# Patient Record
Sex: Female | Born: 1935 | Race: White | Hispanic: No
Health system: Southern US, Community
[De-identification: ages and names within clinical notes are randomized; demographics above are authoritative.]

## PROBLEM LIST (undated history)

## (undated) DIAGNOSIS — I10 Essential (primary) hypertension: Secondary | ICD-10-CM

## (undated) DIAGNOSIS — R0609 Other forms of dyspnea: Secondary | ICD-10-CM

## (undated) DIAGNOSIS — R0989 Other specified symptoms and signs involving the circulatory and respiratory systems: Secondary | ICD-10-CM

## (undated) DIAGNOSIS — R55 Syncope and collapse: Secondary | ICD-10-CM

## (undated) DIAGNOSIS — N179 Acute kidney failure, unspecified: Secondary | ICD-10-CM

## (undated) DIAGNOSIS — F0390 Unspecified dementia without behavioral disturbance: Secondary | ICD-10-CM

## (undated) DIAGNOSIS — R1110 Vomiting, unspecified: Secondary | ICD-10-CM

## (undated) DIAGNOSIS — R0600 Dyspnea, unspecified: Secondary | ICD-10-CM

## (undated) DIAGNOSIS — I447 Left bundle-branch block, unspecified: Secondary | ICD-10-CM

## (undated) DIAGNOSIS — J45909 Unspecified asthma, uncomplicated: Secondary | ICD-10-CM

## (undated) DIAGNOSIS — R011 Cardiac murmur, unspecified: Secondary | ICD-10-CM

## (undated) DIAGNOSIS — D751 Secondary polycythemia: Secondary | ICD-10-CM

## (undated) DIAGNOSIS — D45 Polycythemia vera: Secondary | ICD-10-CM

## (undated) HISTORY — DX: Other specified symptoms and signs involving the circulatory and respiratory systems: R09.89

## (undated) HISTORY — DX: Polycythemia vera: D45

## (undated) HISTORY — DX: Unspecified asthma, uncomplicated: J45.909

## (undated) HISTORY — DX: Other forms of dyspnea: R06.09

## (undated) HISTORY — DX: Unspecified dementia, unspecified severity, without behavioral disturbance, psychotic disturbance, mood disturbance, and anxiety: F03.90

## (undated) HISTORY — DX: Syncope and collapse: R55

## (undated) HISTORY — DX: Secondary polycythemia: D75.1

## (undated) HISTORY — PX: HEMORRHOID SURGERY: SHX153

## (undated) HISTORY — DX: Essential (primary) hypertension: I10

## (undated) HISTORY — DX: Dyspnea, unspecified: R06.00

## (undated) HISTORY — DX: Left bundle-branch block, unspecified: I44.7

## (undated) HISTORY — PX: CYST EXCISION: SHX5701

## (undated) HISTORY — DX: Cardiac murmur, unspecified: R01.1

---

## 2017-12-28 ENCOUNTER — Ambulatory Visit (INDEPENDENT_AMBULATORY_CARE_PROVIDER_SITE_OTHER): Payer: MEDICARE | Admitting: Allergy and Immunology

## 2017-12-28 ENCOUNTER — Encounter: Payer: Self-pay | Admitting: Allergy and Immunology

## 2017-12-28 VITALS — BP 110/70 | HR 80 | Temp 98.0°F | Resp 18 | Ht 60.0 in | Wt 134.2 lb

## 2017-12-28 DIAGNOSIS — I509 Heart failure, unspecified: Secondary | ICD-10-CM | POA: Diagnosis not present

## 2017-12-28 DIAGNOSIS — J3089 Other allergic rhinitis: Secondary | ICD-10-CM

## 2017-12-28 DIAGNOSIS — J4540 Moderate persistent asthma, uncomplicated: Secondary | ICD-10-CM | POA: Diagnosis not present

## 2017-12-28 DIAGNOSIS — E039 Hypothyroidism, unspecified: Secondary | ICD-10-CM

## 2017-12-28 DIAGNOSIS — R011 Cardiac murmur, unspecified: Secondary | ICD-10-CM

## 2017-12-28 DIAGNOSIS — D45 Polycythemia vera: Secondary | ICD-10-CM

## 2017-12-28 MED ORDER — MONTELUKAST SODIUM 10 MG PO TABS: 10.0000 mg | ORAL_TABLET | Freq: Every day | ORAL | 1 refills | Status: DC

## 2017-12-28 MED ORDER — ALBUTEROL SULFATE HFA 108 (90 BASE) MCG/ACT IN AERS: INHALATION_SPRAY | RESPIRATORY_TRACT | 1 refills | Status: DC

## 2017-12-28 NOTE — Progress Notes (Signed)
NEW PATIENT NOTE  Referring Provider: No ref. provider found Primary Provider: Patient, No Pcp Per Date of office visit: 12/28/2017    Subjective:   Chief Complaint:  Jennifer Melton (DOB: [birth year]) is a 84 y.o. female who presents to the clinic on 12/28/2017 with a chief complaint of Asthma .  HPI: Jennifer Melton presents to this clinic in evaluation of breathing issues.  She has recently moved in with her daughter located in [place] from [place].  She has a long history of "asthma".  But she cannot really remember the details of her asthma but she does know that she is using Dulera every day and using albuterol every day.  She does not really have any significant coughing or wheezing or shortness of breath although when she walks for a long period in time she may get a little bit out of breath.  She does not know if she has required any systemic steroids to treat this issue in a year.  Apparently she was skin tested many years ago and found to be allergic to various allergens and was started on immunotherapy which she has been using this therapy for years.  She also continues to use Flonase for her nose and continues to use montelukast but she is not really sure why she is using this medication.  Recently she had to go to the emergency room about a week or 2 ago for issues with shortness of breath and apparently she was told that she has congestive heart failure.  She is not really sure about what type of therapy she received in the emergency room but she does arrive today using Lasix which may have been a medication she has been using for years.  She has polycythemia vera and has been receiving monthly phlebotomy and has been placed on hydroxyurea for some period in time may be several years.  Past Medical History:  Diagnosis Date  . Asthma   . Dementia ([place])   . High blood pressure   . Polycythemia vera ([place])     Past Surgical History:  Procedure Laterality  Date  . CYST EXCISION     Cyst removal on left breast  . HEMORRHOID SURGERY      Allergies as of 12/28/2017   Not on File     Medication List      albuterol (2.5 MG/3ML) 0.083% nebulizer solution Commonly known as:  PROVENTIL Take 2.5 mg by nebulization every 4 (four) hours as needed for wheezing or shortness of breath.   cetirizine 10 MG tablet Commonly known as:  ZYRTEC Take 5 mg by mouth daily.   donepezil 10 MG tablet Commonly known as:  ARICEPT Take 10 mg by mouth daily.   DULERA 200-5 MCG/ACT Aero Generic drug:  mometasone-formoterol Inhale 2 puffs into the lungs 2 (two) times daily.   fluticasone 50 MCG/ACT nasal spray Commonly known as:  FLONASE Place 1 spray into both nostrils 2 (two) times daily.   furosemide 40 MG tablet Commonly known as:  LASIX Take 40 mg by mouth daily.   hydroxyurea 500 MG capsule Commonly known as:  HYDREA Take 500 mg by mouth daily. May take with food to minimize GI side effects.   ipratropium 0.06 % nasal spray Commonly known as:  ATROVENT Place 2 sprays into both nostrils 2 (two) times daily.   losartan 50 MG tablet Commonly known as:  COZAAR Take 50 mg by mouth daily.   montelukast 10 MG  tablet Commonly known as:  SINGULAIR Take 10 mg by mouth at bedtime.   Olopatadine HCl 0.6 % Soln Place 1 spray into both nostrils 2 (two) times daily.   PARoxetine 40 MG tablet Commonly known as:  PAXIL Take 40 mg by mouth daily.       Review of systems negative except as noted in HPI / PMHx or noted below:  Review of Systems  Constitutional: Negative.   HENT: Negative.   Eyes: Negative.   Respiratory: Negative.   Cardiovascular: Negative.   Gastrointestinal: Negative.   Genitourinary: Negative.   Musculoskeletal: Negative.   Skin: Negative.   Neurological: Negative.   Endo/Heme/Allergies: Negative.   Psychiatric/Behavioral: Negative.     Family History  Problem Relation Age of Onset  . Parkinson's disease Mother     . Heart attack Father   . Heart attack Brother   . Fibromyalgia Daughter     Social History   Socioeconomic History  . Marital status: Single    Spouse name: Not on file  . Number of children: Not on file  . Years of education: Not on file  . Highest education level: Not on file  Occupational History  . Not on file  Social Needs  . Financial resource strain: Not on file  . Food insecurity:    Worry: Not on file    Inability: Not on file  . Transportation needs:    Medical: Not on file    Non-medical: Not on file  Tobacco Use  . Smoking status: Passive Smoke Exposure - Never Smoker  . Smokeless tobacco: Never Used  Substance and Sexual Activity  . Alcohol use: Never    Frequency: Never  . Drug use: Never  . Sexual activity: Not on file  Lifestyle  . Physical activity:    Days per week: Not on file    Minutes per session: Not on file  . Stress: Not on file  Relationships  . Social connections:    Talks on phone: Not on file    Gets together: Not on file    Attends religious service: Not on file    Active member of club or organization: Not on file    Attends meetings of clubs or organizations: Not on file    Relationship status: Not on file  . Intimate partner violence:    Fear of current or ex partner: Not on file    Emotionally abused: Not on file    Physically abused: Not on file    Forced sexual activity: Not on file  Other Topics Concern  . Not on file  Social History Narrative  . Not on file    Environmental and Social history  Lives in a house with a dry environment, no animals located inside the household, no carpet in the bedroom, no plastic on the bed, no plastic on the pillow, no smoking ongoing with inside the household.  Objective:   Vitals:   12/28/17 1038  BP: 110/70  Pulse: 80  Resp: 18  Temp: 98 F (36.7 C)  SpO2: 94%   Height: 5' (152.4 cm) Weight: 134 lb 3.2 oz (60.9 kg)  Physical Exam  HENT:  Head: Normocephalic. Head is  without right periorbital erythema and without left periorbital erythema.  Right Ear: Tympanic membrane, external ear and ear canal normal.  Left Ear: Tympanic membrane, external ear and ear canal normal.  Nose: Nose normal. No mucosal edema or rhinorrhea.  Mouth/Throat: Oropharynx is clear and moist and mucous  membranes are normal. No oropharyngeal exudate.  Eyes: Pupils are equal, round, and reactive to light. Conjunctivae and lids are normal.  Neck: Trachea normal. No tracheal deviation present. No thyromegaly present.  Cardiovascular: Normal rate, regular rhythm, S1 normal and S2 normal.  Murmur (Systolic) heard. Pulmonary/Chest: Effort normal. No stridor. No respiratory distress. She has no wheezes. She has no rales. She exhibits no tenderness.  Abdominal: Soft. She exhibits no distension and no mass. There is no hepatosplenomegaly. There is no tenderness. There is no rebound and no guarding.  Musculoskeletal: She exhibits no edema or tenderness.  Lymphadenopathy:       Head (right side): No tonsillar adenopathy present.       Head (left side): No tonsillar adenopathy present.    She has no cervical adenopathy.    She has no axillary adenopathy.  Neurological: She is alert.  Skin: No rash noted. She is not diaphoretic. No erythema. No pallor. Nails show no clubbing.    Diagnostics: Allergy skin tests were not performed.   Spirometry was performed and demonstrated an FEV1 of 1.44 @ 110 % of predicted. FEV1/FVC = 0.84  Assessment and Plan:    1. Asthma, moderate persistent, well-controlled   2. Perennial allergic rhinitis   3. Chronic congestive heart failure, unspecified heart failure type ([place])   4. Systolic murmur   5. Polycythemia vera ([place])   6. Hypothyroidism, unspecified type     1. Continue the following medications:   A. Dulera 200 - 2 inhalations 2 times per day with spacer  B. Fluticasone - 1 spray each nostril 1 times per day  C. Montelukast 10mg  - 1 tablet one  time per day  2. If Needed:   A. Proventil HFA - 2 inhalations every 4-6 hours  B. Cetirizine 10mg  - 1 tablet 1 time per day  C. Nasal ipratropium 0.06% 2 sprays each nostril every 6 hours (drying)  3. Visit with Cardiologist for ECHO  4. Visit with Hematologist for polycythemia vera  5. Review recent chest x-ray, ekg, blood test results.  6. Blood - CBC w/diff, CMP, TSH, T4, BNP, area 2 allergen profile   7. Return in 4 weeks or earlier if problem. Taper medications?  8.  Annual flu vaccine very year  It is not entirely clear why Jennifer Melton has respiratory tract symptoms.  Certainly she may have a component of atopic respiratory disease but may also have a significant issue with stenotic valvular disease of her heart given her murmur and may also have an issue with polycythemia vera contributing to this issue.  I am going to obtain the blood tests noted above and investigation of these issues.  For now she will remain on anti-inflammatory agents for her airway as noted above.  I think in the long run we will be able to consolidate her treatment significantly as we hone down on the causes of her respiratory tract issues.  I will see her back in this clinic in 4 weeks or earlier if there is a problem.  Allena Katz, MD Allergy / Immunology [place]

## 2017-12-28 NOTE — Patient Instructions (Addendum)
  1. Continue the following medications:   A. Dulera 200 - 2 inhalations 2 times per day with spacer  B. Fluticasone - 1 spray each nostril 1 times per day  C. Montelukast 10mg  - 1 tablet one time per day  2. If Needed:   A. Proventil HFA - 2 inhalations every 4-6 hours  B. Cetirizine 10mg  - 1 tablet 1 time per day  C. Nasal ipratropium 0.06% 2 sprays each nostril every 6 hours (drying)  3. Visit with Cardiologist for ECHO  4. Visit with Hematologist for polycythemia vera  5. Review recent chest x-ray, ekg, blood test results.  6. Blood - CBC w/diff, CMP, TSH, T4, BNP, area 2 allergen profile   7. Return in 4 weeks or earlier if problem. Taper medications?  8.  Annual flu vaccine very year

## 2017-12-29 ENCOUNTER — Encounter: Payer: Self-pay | Admitting: Allergy and Immunology

## 2017-12-29 ENCOUNTER — Telehealth: Payer: Self-pay

## 2017-12-29 NOTE — Telephone Encounter (Signed)
Faxed office notes, demographics, insurance cards to Dr. Raquel Sarna office requesting referral for polycythemia vera.

## 2017-12-31 LAB — COMPREHENSIVE METABOLIC PANEL
A/G RATIO: 2 (ref 1.2–2.2)
ALT: 14 IU/L (ref 0–32)
AST: 17 IU/L (ref 0–40)
Albumin: 4.2 g/dL (ref 3.5–4.7)
Alkaline Phosphatase: 79 IU/L (ref 39–117)
BUN/Creatinine Ratio: 25 (ref 12–28)
BUN: 36 mg/dL — ABNORMAL HIGH (ref 8–27)
Bilirubin Total: 0.5 mg/dL (ref 0.0–1.2)
CO2: 30 mmol/L — ABNORMAL HIGH (ref 20–29)
Calcium: 9.4 mg/dL (ref 8.7–10.3)
Chloride: 93 mmol/L — ABNORMAL LOW (ref 96–106)
Creatinine, Ser: 1.46 mg/dL — ABNORMAL HIGH (ref 0.57–1.00)
GFR calc Af Amer: 37 mL/min/{1.73_m2} — ABNORMAL LOW (ref >59)
GFR calc non Af Amer: 32 mL/min/{1.73_m2} — ABNORMAL LOW (ref >59)
Globulin, Total: 2.1 g/dL (ref 1.5–4.5)
Glucose: 98 mg/dL (ref 65–99)
Potassium: 5.1 mmol/L (ref 3.5–5.2)
Sodium: 133 mmol/L — ABNORMAL LOW (ref 134–144)
Total Protein: 6.3 g/dL (ref 6.0–8.5)

## 2017-12-31 LAB — CBC WITH DIFFERENTIAL/PLATELET
Basophils Absolute: 0.1 10*3/uL (ref 0.0–0.2)
Basos: 1 %
EOS (ABSOLUTE): 0.2 10*3/uL (ref 0.0–0.4)
Eos: 4 %
HEMATOCRIT: 34.9 % (ref 34.0–46.6)
Hemoglobin: 12.1 g/dL (ref 11.1–15.9)
Lymphocytes Absolute: 1 10*3/uL (ref 0.7–3.1)
Lymphs: 20 %
MCH: 35 pg — ABNORMAL HIGH (ref 26.6–33.0)
MCHC: 34.7 g/dL (ref 31.5–35.7)
MCV: 101 fL — AB (ref 79–97)
Monocytes Absolute: 0.4 10*3/uL (ref 0.1–0.9)
Monocytes: 7 %
NEUTROS ABS: 3.4 10*3/uL (ref 1.4–7.0)
Neutrophils: 68 %
Platelets: 516 10*3/uL — ABNORMAL HIGH (ref 150–450)
RBC: 3.46 x10E6/uL — ABNORMAL LOW (ref 3.77–5.28)
RDW: 13.7 % (ref 12.3–15.4)
WBC: 5 10*3/uL (ref 3.4–10.8)

## 2017-12-31 LAB — ALLERGENS W/TOTAL IGE AREA 2
Alternaria Alternata IgE: <0.1 kU/L
Aspergillus Fumigatus IgE: <0.1 kU/L
Bermuda Grass IgE: <0.1 kU/L
Cat Dander IgE: <0.1 kU/L
Cedar, Mountain IgE: <0.1 kU/L
Cockroach, German IgE: <0.1 kU/L
Common Silver Birch IgE: <0.1 kU/L
Cottonwood IgE: <0.1 kU/L
D Farinae IgE: <0.1 kU/L
D Pteronyssinus IgE: <0.1 kU/L
Dog Dander IgE: <0.1 kU/L
IgE (Immunoglobulin E), Serum: 33 IU/mL (ref 6–495)
Maple/Box Elder IgE: <0.1 kU/L
Mouse Urine IgE: <0.1 kU/L
Pecan, Hickory IgE: <0.1 kU/L
Pigweed, Rough IgE: <0.1 kU/L
Ragweed, Short IgE: <0.1 kU/L

## 2017-12-31 LAB — TSH+FREE T4
FREE T4: 0.99 ng/dL (ref 0.82–1.77)
TSH: 4.28 u[IU]/mL (ref 0.450–4.500)

## 2017-12-31 LAB — BRAIN NATRIURETIC PEPTIDE: BNP: 43.1 pg/mL (ref 0.0–100.0)

## 2018-01-03 ENCOUNTER — Encounter (HOSPITAL_COMMUNITY): Payer: Self-pay | Admitting: Emergency Medicine

## 2018-01-03 ENCOUNTER — Observation Stay (HOSPITAL_COMMUNITY)
Admission: EM | Admit: 2018-01-03 | Discharge: 2018-01-05 | Disposition: A | Discharge status: Home / Self Care | Payer: MEDICARE | Attending: Internal Medicine | Admitting: Internal Medicine

## 2018-01-03 ENCOUNTER — Other Ambulatory Visit: Payer: Self-pay

## 2018-01-03 ENCOUNTER — Emergency Department (HOSPITAL_COMMUNITY): Payer: MEDICARE

## 2018-01-03 DIAGNOSIS — F419 Anxiety disorder, unspecified: Secondary | ICD-10-CM | POA: Insufficient documentation

## 2018-01-03 DIAGNOSIS — E039 Hypothyroidism, unspecified: Secondary | ICD-10-CM | POA: Diagnosis not present

## 2018-01-03 DIAGNOSIS — I130 Hypertensive heart and chronic kidney disease with heart failure and stage 1 through stage 4 chronic kidney disease, or unspecified chronic kidney disease: Secondary | ICD-10-CM | POA: Diagnosis not present

## 2018-01-03 DIAGNOSIS — Z7722 Contact with and (suspected) exposure to environmental tobacco smoke (acute) (chronic): Secondary | ICD-10-CM | POA: Diagnosis not present

## 2018-01-03 DIAGNOSIS — Z8249 Family history of ischemic heart disease and other diseases of the circulatory system: Secondary | ICD-10-CM | POA: Insufficient documentation

## 2018-01-03 DIAGNOSIS — I10 Essential (primary) hypertension: Secondary | ICD-10-CM | POA: Diagnosis present

## 2018-01-03 DIAGNOSIS — I447 Left bundle-branch block, unspecified: Secondary | ICD-10-CM | POA: Insufficient documentation

## 2018-01-03 DIAGNOSIS — I700 Atherosclerosis of aorta: Secondary | ICD-10-CM | POA: Diagnosis not present

## 2018-01-03 DIAGNOSIS — J45909 Unspecified asthma, uncomplicated: Secondary | ICD-10-CM | POA: Diagnosis present

## 2018-01-03 DIAGNOSIS — I080 Rheumatic disorders of both mitral and aortic valves: Secondary | ICD-10-CM | POA: Diagnosis not present

## 2018-01-03 DIAGNOSIS — Z23 Encounter for immunization: Secondary | ICD-10-CM | POA: Insufficient documentation

## 2018-01-03 DIAGNOSIS — J4531 Mild persistent asthma with (acute) exacerbation: Secondary | ICD-10-CM

## 2018-01-03 DIAGNOSIS — N179 Acute kidney failure, unspecified: Secondary | ICD-10-CM | POA: Diagnosis not present

## 2018-01-03 DIAGNOSIS — Z66 Do not resuscitate: Secondary | ICD-10-CM | POA: Diagnosis not present

## 2018-01-03 DIAGNOSIS — J4540 Moderate persistent asthma, uncomplicated: Secondary | ICD-10-CM | POA: Diagnosis not present

## 2018-01-03 DIAGNOSIS — F0390 Unspecified dementia without behavioral disturbance: Secondary | ICD-10-CM | POA: Insufficient documentation

## 2018-01-03 DIAGNOSIS — R55 Syncope and collapse: Secondary | ICD-10-CM | POA: Diagnosis not present

## 2018-01-03 DIAGNOSIS — I509 Heart failure, unspecified: Secondary | ICD-10-CM | POA: Insufficient documentation

## 2018-01-03 DIAGNOSIS — Z79899 Other long term (current) drug therapy: Secondary | ICD-10-CM | POA: Insufficient documentation

## 2018-01-03 DIAGNOSIS — N183 Chronic kidney disease, stage 3 (moderate): Secondary | ICD-10-CM | POA: Diagnosis not present

## 2018-01-03 DIAGNOSIS — I440 Atrioventricular block, first degree: Secondary | ICD-10-CM | POA: Insufficient documentation

## 2018-01-03 DIAGNOSIS — R1110 Vomiting, unspecified: Secondary | ICD-10-CM | POA: Insufficient documentation

## 2018-01-03 DIAGNOSIS — D45 Polycythemia vera: Secondary | ICD-10-CM | POA: Diagnosis present

## 2018-01-03 DIAGNOSIS — F329 Major depressive disorder, single episode, unspecified: Secondary | ICD-10-CM | POA: Diagnosis not present

## 2018-01-03 HISTORY — DX: Vomiting, unspecified: R11.10

## 2018-01-03 HISTORY — DX: Left bundle-branch block, unspecified: I44.7

## 2018-01-03 HISTORY — DX: Acute kidney failure, unspecified: N17.9

## 2018-01-03 LAB — COMPREHENSIVE METABOLIC PANEL
ALT: 14 U/L (ref 0–44)
AST: 21 U/L (ref 15–41)
Albumin: 3.7 g/dL (ref 3.5–5.0)
Alkaline Phosphatase: 61 U/L (ref 38–126)
Anion gap: 13 (ref 5–15)
BUN: 25 mg/dL — ABNORMAL HIGH (ref 8–23)
CO2: 29 mmol/L (ref 22–32)
Calcium: 9 mg/dL (ref 8.9–10.3)
Chloride: 93 mmol/L — ABNORMAL LOW (ref 98–111)
Creatinine, Ser: 1.51 mg/dL — ABNORMAL HIGH (ref 0.44–1.00)
GFR calc Af Amer: 36 mL/min — ABNORMAL LOW (ref >60)
GFR calc non Af Amer: 31 mL/min — ABNORMAL LOW (ref >60)
Glucose, Bld: 106 mg/dL — ABNORMAL HIGH (ref 70–99)
Potassium: 4.6 mmol/L (ref 3.5–5.1)
Sodium: 135 mmol/L (ref 135–145)
Total Bilirubin: 0.8 mg/dL (ref 0.3–1.2)
Total Protein: 6.2 g/dL — ABNORMAL LOW (ref 6.5–8.1)

## 2018-01-03 LAB — URINALYSIS, ROUTINE W REFLEX MICROSCOPIC
Bilirubin Urine: NEGATIVE
Glucose, UA: NEGATIVE mg/dL
Hgb urine dipstick: NEGATIVE
KETONES UR: NEGATIVE mg/dL
Leukocytes, UA: NEGATIVE
NITRITE: NEGATIVE
Protein, ur: NEGATIVE mg/dL
Specific Gravity, Urine: 1.011 (ref 1.005–1.030)
pH: 6 (ref 5.0–8.0)

## 2018-01-03 LAB — CBC
HCT: 36.1 % (ref 36.0–46.0)
Hemoglobin: 11.6 g/dL — ABNORMAL LOW (ref 12.0–15.0)
MCH: 35.5 pg — ABNORMAL HIGH (ref 26.0–34.0)
MCHC: 32.1 g/dL (ref 30.0–36.0)
MCV: 110.4 fL — ABNORMAL HIGH (ref 80.0–100.0)
Platelets: 474 10*3/uL — ABNORMAL HIGH (ref 150–400)
RBC: 3.27 MIL/uL — AB (ref 3.87–5.11)
RDW: 13.9 % (ref 11.5–15.5)
WBC: 6.3 10*3/uL (ref 4.0–10.5)
nRBC: 0 % (ref 0.0–0.2)

## 2018-01-03 LAB — I-STAT TROPONIN, ED: TROPONIN I, POC: 0 ng/mL (ref 0.00–0.08)

## 2018-01-03 LAB — BRAIN NATRIURETIC PEPTIDE: B Natriuretic Peptide: 92.5 pg/mL (ref 0.0–100.0)

## 2018-01-03 NOTE — ED Notes (Signed)
Patient transported to X-ray 

## 2018-01-03 NOTE — ED Provider Notes (Signed)
[facility] Emergency Department Provider Note MRN:  [id number]  Arrival date & time: 01/03/18     Chief Complaint   Near Syncope   History of Present Illness   Jennifer Melton is a 84 y.o. year-old female with a history of polycythemia vera, dementia presenting to the ED with chief complaint of near syncope.  Patient was at the hair salon at 2 PM when she experienced sudden onset nausea, single episode of nonbloody nonbilious emesis, significant lightheadedness.  Symptoms lasted for several minutes and then slowly resolved.  Brought to urgent care by her daughter.  Urgent care concerned about the symptoms in the setting of left bundle branch block, sent here for further evaluation.  Patient is currently asymptomatic.  No recent fever, no recent cough, no chest pain or shortness of breath during this episode today.  No abdominal pain, no back pain, did not lose consciousness, no recent trauma.  No numbness or weakness to the arms or legs.  Review of Systems  A complete 10 system review of systems was obtained and all systems are negative except as noted in the HPI and PMH.   Patient's Health History    Past Medical History:  Diagnosis Date  . Asthma   . Dementia ([place])   . High blood pressure   . Polycythemia vera ([place])     Past Surgical History:  Procedure Laterality Date  . CYST EXCISION     Cyst removal on left breast  . HEMORRHOID SURGERY      Family History  Problem Relation Age of Onset  . Parkinson's disease Mother   . Heart attack Father   . Heart attack Brother   . Fibromyalgia Daughter     Social History   Socioeconomic History  . Marital status: Single    Spouse name: Not on file  . Number of children: Not on file  . Years of education: Not on file  . Highest education level: Not on file  Occupational History  . Not on file  Social Needs  . Financial resource strain: Not on file  . Food insecurity:    Worry: Not on file    Inability:  Not on file  . Transportation needs:    Medical: Not on file    Non-medical: Not on file  Tobacco Use  . Smoking status: Passive Smoke Exposure - Never Smoker  . Smokeless tobacco: Never Used  Substance and Sexual Activity  . Alcohol use: Never    Frequency: Never  . Drug use: Never  . Sexual activity: Not on file  Lifestyle  . Physical activity:    Days per week: Not on file    Minutes per session: Not on file  . Stress: Not on file  Relationships  . Social connections:    Talks on phone: Not on file    Gets together: Not on file    Attends religious service: Not on file    Active member of club or organization: Not on file    Attends meetings of clubs or organizations: Not on file    Relationship status: Not on file  . Intimate partner violence:    Fear of current or ex partner: Not on file    Emotionally abused: Not on file    Physically abused: Not on file    Forced sexual activity: Not on file  Other Topics Concern  . Not on file  Social History Narrative  . Not on file  Physical Exam  Vital Signs and Nursing Notes reviewed Vitals:   01/03/18 2130 01/03/18 2145  BP: 140/66 (!) 145/59  Pulse: 66 64  Resp: 14 13  Temp:    SpO2: 98% 96%    CONSTITUTIONAL: Well-appearing, NAD NEURO:  Alert and oriented x 3, no focal deficits EYES:  eyes equal and reactive ENT/NECK:  no LAD, no JVD CARDIO: Regular rate, well-perfused, normal S1 and S2 PULM:  CTAB no wheezing or rhonchi GI/GU:  normal bowel sounds, non-distended, non-tender MSK/SPINE:  No gross deformities, no edema SKIN:  no rash, atraumatic PSYCH:  Appropriate speech and behavior  Diagnostic and Interventional Summary    EKG Interpretation  Date/Time:  Tuesday January 03 2018 19:42:37 EST Ventricular Rate:  63 PR Interval:  238 QRS Duration: 144 QT Interval:  464 QTC Calculation: 474 R Axis:   -68 Text Interpretation:  Sinus rhythm with 1st degree A-V block Left bundle branch block Abnormal ECG  Confirmed by Gerlene Fee [phone]) on 01/03/2018 11:04:39 PM      Labs Reviewed  CBC - Abnormal; Notable for the following components:      Result Value   RBC 3.27 (*)    Hemoglobin 11.6 (*)    MCV 110.4 (*)    MCH 35.5 (*)    Platelets 474 (*)    All other components within normal limits  COMPREHENSIVE METABOLIC PANEL - Abnormal; Notable for the following components:   Chloride 93 (*)    Glucose, Bld 106 (*)    BUN 25 (*)    Creatinine, Ser 1.51 (*)    Total Protein 6.2 (*)    GFR calc non Af Amer 31 (*)    GFR calc Af Amer 36 (*)    All other components within normal limits  URINALYSIS, ROUTINE W REFLEX MICROSCOPIC  BRAIN NATRIURETIC PEPTIDE  I-STAT TROPONIN, ED    DG Chest 2 View  Final Result      Medications - No data to display   Procedures Critical Care  ED Course and Medical Decision Making  I have reviewed the triage vital signs and the nursing notes.  Pertinent labs & imaging results that were available during my care of the patient were reviewed by me and considered in my medical decision making (see below for details).  Near syncopal episode in this 84 year old female, no chest pain, single episode emesis.  Unclear etiology, would be atypical for ACS, considering arrhythmia, will place on cardiac monitoring here, work-up pending.  Normal neurological exam, nothing to suggest indication for CNS imaging.  Work-up unremarkable, troponin negative.  Some records of the patient were found, patient does have a history of CHF, polycythemia vera.  Admitted to hospitalist service for further care and evaluation of high risk syncope.  Barth Kirks. Sedonia Small, [place] [email or web address]  Final Clinical Impressions(s) / ED Diagnoses     ICD-10-CM   1. Near syncope R55   2. Emesis R11.10 DG Chest 2 View    DG Chest 2 View    ED Discharge Orders    None         Maudie Flakes, MD 01/03/18 2330

## 2018-01-03 NOTE — ED Triage Notes (Signed)
Pt arrives to ED from [facility] with complaints of a new left BBB. EMS reports pt had a near syncopal episode around 2pm today where she projectile vomited. Pt was seen at the urgent care where they found the LBBB. Pt placed in position of comfort with bed locked and lowered, call bell in reach.

## 2018-01-04 ENCOUNTER — Other Ambulatory Visit (HOSPITAL_COMMUNITY): Payer: PRIVATE HEALTH INSURANCE

## 2018-01-04 ENCOUNTER — Observation Stay (HOSPITAL_BASED_OUTPATIENT_CLINIC_OR_DEPARTMENT_OTHER): Payer: MEDICARE

## 2018-01-04 ENCOUNTER — Encounter (HOSPITAL_COMMUNITY): Payer: Self-pay | Admitting: Internal Medicine

## 2018-01-04 DIAGNOSIS — R55 Syncope and collapse: Secondary | ICD-10-CM | POA: Diagnosis not present

## 2018-01-04 DIAGNOSIS — I340 Nonrheumatic mitral (valve) insufficiency: Secondary | ICD-10-CM | POA: Diagnosis not present

## 2018-01-04 DIAGNOSIS — I351 Nonrheumatic aortic (valve) insufficiency: Secondary | ICD-10-CM | POA: Diagnosis not present

## 2018-01-04 DIAGNOSIS — J45909 Unspecified asthma, uncomplicated: Secondary | ICD-10-CM | POA: Diagnosis present

## 2018-01-04 DIAGNOSIS — I447 Left bundle-branch block, unspecified: Secondary | ICD-10-CM | POA: Insufficient documentation

## 2018-01-04 DIAGNOSIS — D45 Polycythemia vera: Secondary | ICD-10-CM | POA: Diagnosis present

## 2018-01-04 DIAGNOSIS — I10 Essential (primary) hypertension: Secondary | ICD-10-CM | POA: Diagnosis present

## 2018-01-04 DIAGNOSIS — N179 Acute kidney failure, unspecified: Secondary | ICD-10-CM | POA: Diagnosis present

## 2018-01-04 HISTORY — DX: Syncope and collapse: R55

## 2018-01-04 LAB — CBC
HCT: 34.4 % — ABNORMAL LOW (ref 36.0–46.0)
HEMOGLOBIN: 10.7 g/dL — AB (ref 12.0–15.0)
MCH: 34.2 pg — ABNORMAL HIGH (ref 26.0–34.0)
MCHC: 31.1 g/dL (ref 30.0–36.0)
MCV: 109.9 fL — AB (ref 80.0–100.0)
Platelets: 497 10*3/uL — ABNORMAL HIGH (ref 150–400)
RBC: 3.13 MIL/uL — ABNORMAL LOW (ref 3.87–5.11)
RDW: 13.8 % (ref 11.5–15.5)
WBC: 6.1 10*3/uL (ref 4.0–10.5)
nRBC: 0 % (ref 0.0–0.2)

## 2018-01-04 LAB — BASIC METABOLIC PANEL
Anion gap: 8 (ref 5–15)
BUN: 23 mg/dL (ref 8–23)
CO2: 32 mmol/L (ref 22–32)
Calcium: 8.7 mg/dL — ABNORMAL LOW (ref 8.9–10.3)
Chloride: 95 mmol/L — ABNORMAL LOW (ref 98–111)
Creatinine, Ser: 1.47 mg/dL — ABNORMAL HIGH (ref 0.44–1.00)
GFR calc Af Amer: 37 mL/min — ABNORMAL LOW (ref >60)
GFR calc non Af Amer: 32 mL/min — ABNORMAL LOW (ref >60)
Glucose, Bld: 85 mg/dL (ref 70–99)
Potassium: 4.3 mmol/L (ref 3.5–5.1)
Sodium: 135 mmol/L (ref 135–145)

## 2018-01-04 LAB — ECHOCARDIOGRAM COMPLETE
HEIGHTINCHES: 60 in
Weight: 2147.21 oz

## 2018-01-04 LAB — VITAMIN B12: Vitamin B-12: 225 pg/mL (ref 180–914)

## 2018-01-04 MED ORDER — LOSARTAN POTASSIUM 50 MG PO TABS: 50.0000 mg | ORAL_TABLET | Freq: Every day | ORAL | Status: DC

## 2018-01-04 MED ORDER — DONEPEZIL HCL 10 MG PO TABS
10.0000 mg | ORAL_TABLET | Freq: Every day | ORAL | Status: DC
  Administered 2018-01-04 – 2018-01-05 (×2): 10 mg via ORAL
  Filled 2018-01-04: qty 1
  Filled 2018-01-04: qty 2

## 2018-01-04 MED ORDER — HEPARIN SODIUM (PORCINE) 5000 UNIT/ML IJ SOLN
5000.0000 [IU] | Freq: Three times a day (TID) | INTRAMUSCULAR | Status: DC
  Administered 2018-01-04 – 2018-01-05 (×4): 5000 [IU] via SUBCUTANEOUS
  Filled 2018-01-04 (×4): qty 1

## 2018-01-04 MED ORDER — SODIUM CHLORIDE 0.9 % IV SOLN
INTRAVENOUS | Status: AC
  Administered 2018-01-04: 12:00:00 via INTRAVENOUS

## 2018-01-04 MED ORDER — IPRATROPIUM BROMIDE 0.06 % NA SOLN
2.0000 | Freq: Two times a day (BID) | NASAL | Status: DC
  Administered 2018-01-04 – 2018-01-05 (×3): 2 via NASAL
  Filled 2018-01-04: qty 15

## 2018-01-04 MED ORDER — METOPROLOL SUCCINATE ER 25 MG PO TB24: 25.0000 mg | ORAL_TABLET | Freq: Every day | ORAL | Status: DC

## 2018-01-04 MED ORDER — INFLUENZA VAC SPLIT HIGH-DOSE 0.5 ML IM SUSY
0.5000 mL | PREFILLED_SYRINGE | INTRAMUSCULAR | Status: AC
  Administered 2018-01-05: 0.5 mL via INTRAMUSCULAR
  Filled 2018-01-04: qty 0.5

## 2018-01-04 MED ORDER — SODIUM CHLORIDE 0.9% FLUSH
3.0000 mL | Freq: Two times a day (BID) | INTRAVENOUS | Status: DC
  Administered 2018-01-05: 3 mL via INTRAVENOUS

## 2018-01-04 MED ORDER — MOMETASONE FURO-FORMOTEROL FUM 200-5 MCG/ACT IN AERO
2.0000 | INHALATION_SPRAY | Freq: Two times a day (BID) | RESPIRATORY_TRACT | Status: DC
  Administered 2018-01-04 – 2018-01-05 (×2): 2 via RESPIRATORY_TRACT
  Filled 2018-01-04 (×3): qty 8.8

## 2018-01-04 MED ORDER — DIPHENHYDRAMINE HCL 25 MG PO CAPS
25.0000 mg | ORAL_CAPSULE | Freq: Every evening | ORAL | Status: DC | PRN
  Administered 2018-01-05: 25 mg via ORAL
  Filled 2018-01-04: qty 1

## 2018-01-04 MED ORDER — LORATADINE 10 MG PO TABS
10.0000 mg | ORAL_TABLET | Freq: Every day | ORAL | Status: DC
  Administered 2018-01-04 – 2018-01-05 (×2): 10 mg via ORAL
  Filled 2018-01-04 (×2): qty 1

## 2018-01-04 MED ORDER — HYDROXYUREA 500 MG PO CAPS
500.0000 mg | ORAL_CAPSULE | Freq: Every day | ORAL | Status: DC
  Administered 2018-01-04 – 2018-01-05 (×2): 500 mg via ORAL
  Filled 2018-01-04 (×2): qty 1

## 2018-01-04 MED ORDER — ALBUTEROL SULFATE (2.5 MG/3ML) 0.083% IN NEBU: 2.5000 mg | INHALATION_SOLUTION | RESPIRATORY_TRACT | Status: DC | PRN

## 2018-01-04 MED ORDER — FLUTICASONE PROPIONATE 50 MCG/ACT NA SUSP
1.0000 | Freq: Two times a day (BID) | NASAL | Status: DC
  Administered 2018-01-04 – 2018-01-05 (×3): 1 via NASAL
  Filled 2018-01-04: qty 16

## 2018-01-04 MED ORDER — PAROXETINE HCL 20 MG PO TABS
40.0000 mg | ORAL_TABLET | Freq: Every day | ORAL | Status: DC
  Administered 2018-01-04 – 2018-01-05 (×2): 40 mg via ORAL
  Filled 2018-01-04 (×2): qty 2

## 2018-01-04 MED ORDER — MONTELUKAST SODIUM 10 MG PO TABS
10.0000 mg | ORAL_TABLET | Freq: Every day | ORAL | Status: DC
  Administered 2018-01-04: 10 mg via ORAL
  Filled 2018-01-04: qty 1

## 2018-01-04 NOTE — Progress Notes (Signed)
Amber Cox granddaughter [phone].  Wants more information.

## 2018-01-04 NOTE — Progress Notes (Signed)
Pt feeling intermittently flushed.  Pulse 66.  Resp rate normal.  Not diaphoretic.  Not endorsing chest pain.

## 2018-01-04 NOTE — Progress Notes (Signed)
DNR band placed on patient's left wrist.

## 2018-01-04 NOTE — ED Notes (Signed)
Patient transported to echo ?

## 2018-01-04 NOTE — Progress Notes (Addendum)
H & P after midnight. Briefly 84 yo hx moderate persistent asthma, polycythemia vera, anxiety and ? HF admitted with near syncope. Became lightheaded, nausea with vomiting, cool/clammy. No cp, palpitations, fever, chills. Reports being hospitalized 3 weeks ago for chf and bronchitis. In ED EKG revealed LBBB.  Cardiology asked to consult  PE Gen. Awake alert oriented no acute distress CV: rrr +murmur no LE edema Resp: no increased work of breathing no wheeze Abdomen soft+BS no guarding Neuro: speech clear facial symmetry     Plan:  Follow echo Gentle IV fluids Holding home lasix and losartan for now Monitor BP Continue home meds Recheck bmet in am   Jennifer Glad m. Anelly Samarin, NP

## 2018-01-04 NOTE — Progress Notes (Signed)
  Echocardiogram 2D Echocardiogram has been performed.  Jennette Dubin 01/04/2018, 10:48 AM

## 2018-01-04 NOTE — Consult Note (Signed)
CARDIOLOGY CONSULT NOTE  Patient ID: Jennifer Melton MRN: [id number] DOB/AGE: 1927-06-24 84 y.o.  Admit date: 01/03/2018 Referring Physician  Cheri Rous, MD Primary Physician:  Algis Greenhouse, MD Reason for Consultation  Dizziness  HPI: Jennifer Melton  is a 84 y.o. female  With longstanding history of bronchial asthma, hypothyroidism, polycythemia vera, left bundle branch block, chronic dyspnea and chronic stage III kidney disease admitted to the hospital with nausea and vomiting is the main complaint and not syncope.  Patient was at the hairdresser when she suddenly started feeling hot, clammy and had severe nausea.  This was followed by profuse vomiting.  She also felt dizzy after this.  There was no syncope reported.  No chest pain, no shortness of breath.  She was taken to the urgent care and due to abnormal EKG was referred to go to the emergency room.  She was admitted for further evaluation and management with the working diagnosis of near syncope, nausea vomiting and abnormal EKG.  Patient's main complaint is suddenly feeling hot and that has been ongoing for several years.  She feels like it is like "hot flashes" and feels slightly clammy.  She has never had syncope, never had dizziness except one episode sometime this afternoon.  She returned from the bathroom and felt dizzy.  Her daughter is present at the bedside.  Past Medical History:  Diagnosis Date  . Acute kidney injury ([place])   . Asthma   . Dementia ([place])   . High blood pressure   . LBBB (left bundle branch block)   . Polycythemia vera ([place])   . Vomiting 01/03/2018     Past Surgical History:  Procedure Laterality Date  . CYST EXCISION     Cyst removal on left breast  . HEMORRHOID SURGERY       Family History  Problem Relation Age of Onset  . Parkinson's disease Mother   . Heart attack Father   . Heart attack Brother   . Fibromyalgia Daughter      Social History: Social History   Socioeconomic  History  . Marital status: Single    Spouse name: Not on file  . Number of children: Not on file  . Years of education: Not on file  . Highest education level: Not on file  Occupational History  . Not on file  Social Needs  . Financial resource strain: Not on file  . Food insecurity:    Worry: Not on file    Inability: Not on file  . Transportation needs:    Medical: Not on file    Non-medical: Not on file  Tobacco Use  . Smoking status: Passive Smoke Exposure - Never Smoker  . Smokeless tobacco: Never Used  Substance and Sexual Activity  . Alcohol use: Never    Frequency: Never  . Drug use: Never  . Sexual activity: Not on file  Lifestyle  . Physical activity:    Days per week: Not on file    Minutes per session: Not on file  . Stress: Not on file  Relationships  . Social connections:    Talks on phone: Not on file    Gets together: Not on file    Attends religious service: Not on file    Active member of club or organization: Not on file    Attends meetings of clubs or organizations: Not on file    Relationship status: Not on file  . Intimate partner violence:    Fear of  current or ex partner: Not on file    Emotionally abused: Not on file    Physically abused: Not on file    Forced sexual activity: Not on file  Other Topics Concern  . Not on file  Social History Narrative  . Not on file     Medications Prior to Admission  Medication Sig Dispense Refill Last Dose  . albuterol (PROVENTIL HFA) 108 (90 Base) MCG/ACT inhaler Inhale two puffs every four to six hours as needed for cough or wheeze. 1 Inhaler 1 Past Month at Unknown time  . albuterol (PROVENTIL) (2.5 MG/3ML) 0.083% nebulizer solution Take 2.5 mg by nebulization every 4 (four) hours as needed for wheezing or shortness of breath.   Past Month at Unknown time  . cetirizine (ZYRTEC) 10 MG tablet Take 10 mg by mouth daily as needed for allergies.    Past Month at Unknown time  . donepezil (ARICEPT) 10 MG  tablet Take 10 mg by mouth daily.   01/02/2018 at Unknown time  . fluticasone (FLONASE) 50 MCG/ACT nasal spray Place 1 spray into both nostrils 2 (two) times daily.   01/02/2018 at Unknown time  . furosemide (LASIX) 40 MG tablet Take 40 mg by mouth daily.   01/02/2018 at Unknown time  . hydroxyurea (HYDREA) 500 MG capsule Take 500 mg by mouth daily. May take with food to minimize GI side effects.   01/02/2018 at Unknown time  . ipratropium (ATROVENT) 0.06 % nasal spray Place 2 sprays into both nostrils 2 (two) times daily.   01/02/2018 at Unknown time  . losartan (COZAAR) 50 MG tablet Take 50 mg by mouth daily.   01/02/2018 at Unknown time  . mometasone-formoterol (DULERA) 200-5 MCG/ACT AERO Inhale 2 puffs into the lungs 2 (two) times daily.   01/02/2018 at Unknown time  . montelukast (SINGULAIR) 10 MG tablet Take 1 tablet (10 mg total) by mouth at bedtime. 90 tablet 1 Past Week at Unknown time  . Olopatadine HCl 0.6 % SOLN Place 1 spray into both nostrils 2 (two) times daily as needed (allergies).    Past Month at Unknown time  . PARoxetine (PAXIL) 40 MG tablet Take 40 mg by mouth daily.   01/02/2018 at Unknown time   Review of Systems  Constitutional: Negative.   HENT: Negative.   Eyes: Negative.   Respiratory: Positive for shortness of breath (slight worsening dyspnea over few months. ). Negative for hemoptysis and wheezing.   Cardiovascular: Negative.   Gastrointestinal: Positive for nausea and vomiting.  Genitourinary: Negative.   Skin: Negative.   Endo/Heme/Allergies: Negative.   Psychiatric/Behavioral: Negative.   All other systems reviewed and are negative.   Physical Exam: Blood pressure (!) 141/80, pulse 64, temperature 98.1 F (36.7 C), temperature source Oral, resp. rate 16, height 5' (1.524 m), weight 60.9 kg, SpO2 96 %.  Physical Exam  Constitutional: She is oriented to person, place, and time. She appears well-developed and well-nourished. No distress.  HENT:  Head: Atraumatic.   Eyes: Conjunctivae are normal.  Neck: Neck supple. No JVD present.  Cardiovascular: Normal rate, regular rhythm and intact distal pulses. Exam reveals no gallop.  Murmur (1/6 to 2/6 systolic ejection murmur in the right sternal border) heard. Pulses:      Carotid pulses are on the right side with bruit, and on the left side with bruit. Varisose veings  Pulmonary/Chest: Breath sounds normal.  Abdominal: Soft. Bowel sounds are normal.  Neurological: She is alert and oriented to person, place, and  time.  Skin: Skin is warm and dry.  Psychiatric: She has a normal mood and affect.  Labs:   Lab Results  Component Value Date   WBC 6.1 01/04/2018   HGB 10.7 (L) 01/04/2018   HCT 34.4 (L) 01/04/2018   MCV 109.9 (H) 01/04/2018   PLT 497 (H) 01/04/2018    Recent Labs  Lab 01/03/18 1910 01/04/18 0242  NA 135 135  K 4.6 4.3  CL 93* 95*  CO2 29 32  BUN 25* 23  CREATININE 1.51* 1.47*  CALCIUM 9.0 8.7*  PROT 6.2*  --   BILITOT 0.8  --   ALKPHOS 61  --   ALT 14  --   AST 21  --   GLUCOSE 106* 85    Lipid Panel  No results found for: CHOL, TRIG, HDL, CHOLHDL, VLDL, LDLCALC  BNP (last 3 results) Recent Labs    12/28/17 1154 01/03/18 1910  BNP 43.1 92.5  TSH Recent Labs    12/28/17 1154  TSH 4.280     Radiology: Dg Chest 2 View  Result Date: 01/03/2018 CLINICAL DATA:  Emesis EXAM: CHEST - 2 VIEW COMPARISON:  None. FINDINGS: Cardiac enlargement without heart failure. Atherosclerotic aortic arch. Lungs are clear without infiltrate or effusion. IMPRESSION: No active cardiopulmonary disease. Electronically Signed   By: Franchot Gallo M.D.   On: 01/03/2018 19:46    Scheduled Meds: . donepezil  10 mg Oral Daily  . fluticasone  1 spray Each Nare BID  . heparin  5,000 Units Subcutaneous Q8H  . hydroxyurea  500 mg Oral Daily  . [START ON 01/05/2018] Influenza vac split quadrivalent PF  0.5 mL Intramuscular Tomorrow-1000  . ipratropium  2 spray Each Nare BID  . loratadine   10 mg Oral Daily  . mometasone-formoterol  2 puff Inhalation BID  . montelukast  10 mg Oral QHS  . PARoxetine  40 mg Oral Daily  . sodium chloride flush  3 mL Intravenous Q12H   Continuous Infusions: . sodium chloride 50 mL/hr at 01/04/18 1155   PRN Meds:.albuterol  CARDIAC STUDIES:  EKG: 01/04/2018: Sinus rhythm with first-degree AV block, underlying left bundle branch block.  No further analysis.  ECHO: 01/04/2018: Normal LV systolic function, EF 55 to 60%.  Grade 1 diastolic dysfunction.  Mild aortic regurgitation and mitral regurgitation.  Mild biatrial enlargement.  ASSESSMENT AND PLAN:  1.  Vasovagal symptoms of nausea and dizziness, preceding feeling heart.  No frank syncope. 2.  Abnormal EKG with first-degree AV block and left bundle branch block, clinically asymptomatic without syncope, bradycardia or decreased exercise tolerance although she does have slight worsening dyspnea on exertion recently. 3.  3 beat run of NSVT this morning, asymptomatic. 4.  Polycythemia vera, presently on hydroxyurea.  Also has anemia and thrombocytopenia and this is also contributed by hydroxyurea. 5.  Hot flashes, etiology unknown. 6.  Chronic stage III kidney disease, stable from the labs that I have in the office as well.  Do not think dehydration is an etiology.  Recommendation: Although patient's symptoms are vasovagal, there is no cardiac work-up necessary for now, normal LVEF, underlying LBBB, does need ischemic work-up but can be performed in the outpatient basis.  Symptoms of "hot flashes" may be related to hydroxyurea therapy which can cause nausea, vomiting, GI issues and can also cause facial erythema.  Patient is also on paroxetine which can cause serotonin-like symptoms including dizziness, diaphoresis and GI issues.  Appears that hydroxyurea may be contributing to some of  her symptoms including anemia and thrombocytopenia.  With regard to NSVT, with underlying first-degree AV block,  LBBB, advanced age, she may have conduction system disease, hence would be careful with addition of medications but will try metoprolol succinate 25 mg daily.  Will administer 1 dose tonight so while she is on telemetry we can observe any significant arrhythmias and heart block.  If she remains stable by tomorrow morning, she can be discharged home and I will continue to follow her in the outpatient basis.  Adrian Prows, MD 01/04/2018, 7:27 PM [facility] [place] Pager: [phone] Office: [phone] If no answer Cell [phone]

## 2018-01-04 NOTE — H&P (Signed)
History and Physical   Jennifer Melton BJS:[id number] DOB: [birth year] DOA: 01/03/2018  PCP: Patient, No Pcp Per  Chief Complaint: near syncope  HPI: This is an 84 year old woman with medical problems including moderate persistent asthma, polycythemia vera, anxiety/depression, and reported history of heart failure presenting with near syncope.  History is obtained via patient and daughter report.  They report the patient moved in with her daughter within the past month.  At baseline, the patient ambulates without assist devices, is independent in her ADLs, but does need IADL support.  She formally worked in [employer], does not smoke.  Formally lived in [place].  Events leading to this admission include, she was at a beauty shop where she was sitting down and became lightheaded and had multiple episodes of emesis.  She had feelings of hot and cold, but did not pass out.  Denies palpitations, chest pain, fevers, chills.  Denies dehydration, or inability to keep down p.o. prior to this episode.  Has never had these symptoms before, but was hospitalized 2 to 3 weeks ago for CHF and bronchitis.  Due to the symptoms she was brought to an urgent care where an EKG was performed which revealed left bundle branch block.  This resulted in her coming to the emergency department be further evaluated.    She does use a nebulizer treatment of bronchodilators about once daily.  She has established allergy immunology care for her asthma locally.  Has yet to establish a hematologist oncologist for management of her polycythemia vera.  Denies ever having heart attack or stroke.  She does have dyspnea on exertion, but no chest pain.  ED Course: In the emergency department vital signs are normal for systolic blood pressure 607, heart rate 64.  CBC remarkable for hemoglobin 11.6 MCV 110, BMP remarkable for creatinine 1.5, BUN 25.  Troponin within normal limits.  BNP within normal limits.  EKG revealed  sinus rhythm with first-degree AV nodal block, left bundle branch block present.  Hospital medicine was consulted for further management.  Review of Systems: A complete ROS was obtained; pertinent positives negatives are denoted in the HPI. Otherwise, all systems are negative.   Past Medical History:  Diagnosis Date  . Asthma   . Dementia ([place])   . High blood pressure   . Polycythemia vera ([place])    Social History   Socioeconomic History  . Marital status: Single    Spouse name: Not on file  . Number of children: Not on file  . Years of education: Not on file  . Highest education level: Not on file  Occupational History  . Not on file  Social Needs  . Financial resource strain: Not on file  . Food insecurity:    Worry: Not on file    Inability: Not on file  . Transportation needs:    Medical: Not on file    Non-medical: Not on file  Tobacco Use  . Smoking status: Passive Smoke Exposure - Never Smoker  . Smokeless tobacco: Never Used  Substance and Sexual Activity  . Alcohol use: Never    Frequency: Never  . Drug use: Never  . Sexual activity: Not on file  Lifestyle  . Physical activity:    Days per week: Not on file    Minutes per session: Not on file  . Stress: Not on file  Relationships  . Social connections:    Talks on phone: Not on file    Gets together: Not on  file    Attends religious service: Not on file    Active member of club or organization: Not on file    Attends meetings of clubs or organizations: Not on file    Relationship status: Not on file  . Intimate partner violence:    Fear of current or ex partner: Not on file    Emotionally abused: Not on file    Physically abused: Not on file    Forced sexual activity: Not on file  Other Topics Concern  . Not on file  Social History Narrative  . Not on file   Family History  Problem Relation Age of Onset  . Parkinson's disease Mother   . Heart attack Father   . Heart attack Brother   .  Fibromyalgia Daughter     Physical Exam: Vitals:   01/03/18 2145 01/04/18 0004 01/04/18 0030 01/04/18 0104  BP: (!) 145/59 (!) 142/74 (!) 137/54 (!) 130/96  Pulse: 64 82 65 64  Resp: 13 15 18    Temp:      TempSrc:      SpO2: 96% 99% 94% 95%  Weight:      Height:       General: Appears calm and comfortable.  Pleasant white elderly woman. ENT: Hard of hearing, moist mucous membranes. Cardiovascular: RRR. No M/R/G. No LE edema.  Respiratory: CTA bilaterally. No wheezes or crackles. Normal respiratory effort.  Breathing room air Abdomen: Soft, non-tender.  Skin: No rash or induration seen on limited exam. Musculoskeletal: Grossly normal tone BUE/BLE. Appropriate ROM.  Psychiatric: Grossly normal mood and affect. Neurologic: Moves all extremities in coordinated fashion.  Oriented to year.  I have personally reviewed the following labs, culture data, and imaging studies.  Assessment/Plan:  #Near syncope Course: symptoms of light headedness, nausea, vomiting, and near syncope; no overt preceding symptoms, occurred when sitting at beauty shop.  EKG with LBBB and 1st degree AV nodal block.  A/P: will monitor with telemetry to evaluate for arrhythmia etiology to near syncope; TTE to evaluate for structural causes.  Orthostatic vital signs ordered.  Unknown BUN/CR baseline, it is possible that she has been over-diuresed; will hold home diuretic therapy.  #Other problems: -Asthma: does not appear to be in exacerbation, continue home medications -Polycythemia vera: continue home hydroxyurea, needs to establish hem/onc locally; hydrea is cause of macrocytosis likely, but will obtain b12 for completeness -HF, hx of: unknown type; holding diuretic; appears to not be in HF -Anxiety / depression: continue home SSRI -CKD vs AKI: uncertain bl BUN/CR however, BUN/CR on admission of 25 / 1.51; trend with diuresis holding, attempt to obtain outside records for comparison -HTN: continue ARB  DVT  prophylaxis: Subq Lovenox Code Status: DNR / DNI  Disposition Plan: Anticipate D/C home within 24-48 hours likely Consults called:  none Admission status: admit to hospital medicine, telemetry    Cheri Rous, MD [facility] [phone]  If 7PM-7AM, please contact night-coverage [email or web address] Password [email or web address]  This document was created using the aid of voice recognition / dication software.

## 2018-01-04 NOTE — ED Notes (Signed)
Patient's daughter at bedside.

## 2018-01-05 DIAGNOSIS — N179 Acute kidney failure, unspecified: Secondary | ICD-10-CM | POA: Diagnosis not present

## 2018-01-05 DIAGNOSIS — R55 Syncope and collapse: Secondary | ICD-10-CM | POA: Diagnosis not present

## 2018-01-05 DIAGNOSIS — I10 Essential (primary) hypertension: Secondary | ICD-10-CM | POA: Diagnosis not present

## 2018-01-05 DIAGNOSIS — J4531 Mild persistent asthma with (acute) exacerbation: Secondary | ICD-10-CM | POA: Diagnosis not present

## 2018-01-05 DIAGNOSIS — D45 Polycythemia vera: Secondary | ICD-10-CM

## 2018-01-05 LAB — BASIC METABOLIC PANEL
ANION GAP: 9 (ref 5–15)
BUN: 22 mg/dL (ref 8–23)
CO2: 27 mmol/L (ref 22–32)
Calcium: 8.2 mg/dL — ABNORMAL LOW (ref 8.9–10.3)
Chloride: 100 mmol/L (ref 98–111)
Creatinine, Ser: 1.53 mg/dL — ABNORMAL HIGH (ref 0.44–1.00)
GFR calc Af Amer: 35 mL/min — ABNORMAL LOW (ref >60)
GFR calc non Af Amer: 30 mL/min — ABNORMAL LOW (ref >60)
Glucose, Bld: 86 mg/dL (ref 70–99)
Potassium: 3.9 mmol/L (ref 3.5–5.1)
Sodium: 136 mmol/L (ref 135–145)

## 2018-01-05 MED ORDER — METOPROLOL SUCCINATE ER 25 MG PO TB24: 25.0000 mg | ORAL_TABLET | Freq: Every day | ORAL | 1 refills | Status: DC

## 2018-01-05 NOTE — Care Management Obs Status (Signed)
[facility] NOTIFICATION   Patient Details  Name: Jennifer Melton MRN: [id number] Date of Birth: 03-Jun-1927   Medicare Observation Status Notification Given:  Yes    Midge Minium RN, BSN, NCM-BC, ACM-RN [phone] 01/05/2018, 11:51 AM

## 2018-01-05 NOTE — Discharge Summary (Signed)
Physician Discharge Summary  Jennifer Melton ATF:[id number] DOB: [birth year] DOA: 01/03/2018  PCP: Jennifer Greenhouse, MD  Admit date: 01/03/2018 Discharge date: 01/05/2018  Time spent: 45 minutes  Recommendations for Outpatient Follow-up:  1. Follow up with Dr Jennifer Melton as instructed 2. Follow up with PCP 1-2 weeks for evaluation of BP control and kidney function and possible vertigo   Discharge Diagnoses:  Principal Problem:   Near syncope Active Problems:   High blood pressure   Acute kidney injury ([place])   Asthma   Polycythemia vera ([name])   Discharge Condition: stable  Diet recommendation: heart healthy carb modified  Filed Weights   01/03/18 1852  Weight: 60.9 kg    History of present illness:  Jennifer Melton  is a 84 y.o. female  With longstanding history of bronchial asthma, hypothyroidism, polycythemia vera, left bundle branch block, chronic dyspnea and chronic stage III kidney disease presented to the hospital with nausea and vomiting and diaphoresis. Found to have abnormal ekg. Cardiology consulted  Hospital Course:  Acute nausea and vomiting with diaphoresis.  symptoms of light headedness, nausea, vomiting. no overt preceding symptoms, occurred when sitting at beauty shop. No cp palpitations. EKG with LBBB and 1st degree AV nodal block. No further episodes. Evaluated by cards who opine vasovagal and no frank syncope. Normal EF underlying LBBB per cards and recommends OP ischemic work up  Abnormal EKG with first degree AV block and LBBB. Asymptomatic without syncope, bradycardia. Evaluated by cards who opine may have conduction system disease but recommended metoprolol succinate 25 mg daily. No events on tele during night. Follow up with dr Jennifer Melton.  -Asthma: stable continue home medications -Polycythemia vera: continue home hydroxyurea, needs to establish hem/onc locally; B12 225.,  -HF, hx of: unknown type; holding diuretic; appears to not be in HF -Anxiety /  depression: continue home SSRI -CKD vs AKI: uncertain bl BUN/CR however, BUN/CR on admission of 25 / 1.51; OP follow up with PCP -HTN: continue ARB  Procedures:    Consultations:  Dr Jennifer Melton cardiology  Discharge Exam: Vitals:   01/05/18 1039 01/05/18 1040  BP: (!) 129/51 121/71  Pulse: 66 72  Resp: 20 20  Temp:    SpO2: 96% 96%    General: well nourished in no acute distress, experiencing some dizziness with nausea with position change that quickly resolves Cardiovascular: rrr no mgr no LE edema Respiratory: normal effort BS clear bilaterally no wheeze  Discharge Instructions   Discharge Instructions    Call MD for:  extreme fatigue   Complete by:  As directed    Call MD for:  persistant dizziness or light-headedness   Complete by:  As directed    Diet - low sodium heart healthy   Complete by:  As directed    Discharge instructions   Complete by:  As directed    Follow up with dr Jennifer Melton and instructed   Increase activity slowly   Complete by:  As directed      Allergies as of 01/05/2018      Reactions   Ciprofloxacin Other (See Comments)   unknown   Penicillins Other (See Comments)   unknown   Pseudoephedrine Other (See Comments)   unknown      Medication List    TAKE these medications   albuterol (2.5 MG/3ML) 0.083% nebulizer solution Commonly known as:  PROVENTIL Take 2.5 mg by nebulization every 4 (four) hours as needed for wheezing or shortness of breath.   albuterol 108 (90 Base) MCG/ACT  inhaler Commonly known as:  PROVENTIL HFA Inhale two puffs every four to six hours as needed for cough or wheeze.   cetirizine 10 MG tablet Commonly known as:  ZYRTEC Take 10 mg by mouth daily as needed for allergies.   donepezil 10 MG tablet Commonly known as:  ARICEPT Take 10 mg by mouth daily.   DULERA 200-5 MCG/ACT Aero Generic drug:  mometasone-formoterol Inhale 2 puffs into the lungs 2 (two) times daily.   fluticasone 50 MCG/ACT nasal  spray Commonly known as:  FLONASE Place 1 spray into both nostrils 2 (two) times daily.   furosemide 40 MG tablet Commonly known as:  LASIX Take 40 mg by mouth daily.   hydroxyurea 500 MG capsule Commonly known as:  HYDREA Take 500 mg by mouth daily. May take with food to minimize GI side effects.   ipratropium 0.06 % nasal spray Commonly known as:  ATROVENT Place 2 sprays into both nostrils 2 (two) times daily.   losartan 50 MG tablet Commonly known as:  COZAAR Take 50 mg by mouth daily.   metoprolol succinate 25 MG 24 hr tablet Commonly known as:  TOPROL-XL Take 1 tablet (25 mg total) by mouth daily. Start taking on:  January 06, 2018   montelukast 10 MG tablet Commonly known as:  SINGULAIR Take 1 tablet (10 mg total) by mouth at bedtime.   Olopatadine HCl 0.6 % Soln Place 1 spray into both nostrils 2 (two) times daily as needed (allergies).   PARoxetine 40 MG tablet Commonly known as:  PAXIL Take 40 mg by mouth daily.      Allergies  Allergen Reactions  . Ciprofloxacin Other (See Comments)    unknown  . Penicillins Other (See Comments)    unknown  . Pseudoephedrine Other (See Comments)    unknown      The results of significant diagnostics from this hospitalization (including imaging, microbiology, ancillary and laboratory) are listed below for reference.    Significant Diagnostic Studies: Dg Chest 2 View  Result Date: 01/03/2018 CLINICAL DATA:  Emesis EXAM: CHEST - 2 VIEW COMPARISON:  None. FINDINGS: Cardiac enlargement without heart failure. Atherosclerotic aortic arch. Lungs are clear without infiltrate or effusion. IMPRESSION: No active cardiopulmonary disease. Electronically Signed   By: Jennifer Melton M.D.   On: 01/03/2018 19:46    Microbiology: No results found for this or any previous visit (from the past 240 hour(s)).   Labs: Basic Metabolic Panel: Recent Labs  Lab 01/03/18 1910 01/04/18 0242 01/05/18 0334  NA 135 135 136  K 4.6 4.3  3.9  CL 93* 95* 100  CO2 29 32 27  GLUCOSE 106* 85 86  BUN 25* 23 22  CREATININE 1.51* 1.47* 1.53*  CALCIUM 9.0 8.7* 8.2*   Liver Function Tests: Recent Labs  Lab 01/03/18 1910  AST 21  ALT 14  ALKPHOS 61  BILITOT 0.8  PROT 6.2*  ALBUMIN 3.7   No results for input(s): LIPASE, AMYLASE in the last 168 hours. No results for input(s): AMMONIA in the last 168 hours. CBC: Recent Labs  Lab 01/03/18 1910 01/04/18 0242  WBC 6.3 6.1  HGB 11.6* 10.7*  HCT 36.1 34.4*  MCV 110.4* 109.9*  PLT 474* 497*   Cardiac Enzymes: No results for input(s): CKTOTAL, CKMB, CKMBINDEX, TROPONINI in the last 168 hours. BNP: BNP (last 3 results) Recent Labs    12/28/17 1154 01/03/18 1910  BNP 43.1 92.5    ProBNP (last 3 results) No results for input(s): PROBNP in  the last 8760 hours.  CBG: No results for input(s): GLUCAP in the last 168 hours.     SignedRadene Gunning NP [facility] 01/05/2018, 12:18 PM

## 2018-01-10 NOTE — Telephone Encounter (Signed)
Received fax back from [facility] stating that patient has been scheduled to see Dr. Lauretta Chester on Tuesday, January 17, 2018, at 10:00 am.  Per fax, patient has been notified.

## 2018-01-11 ENCOUNTER — Telehealth: Payer: Self-pay

## 2018-01-11 ENCOUNTER — Ambulatory Visit (INDEPENDENT_AMBULATORY_CARE_PROVIDER_SITE_OTHER): Payer: MEDICARE | Admitting: Allergy and Immunology

## 2018-01-11 ENCOUNTER — Encounter: Payer: Self-pay | Admitting: Allergy and Immunology

## 2018-01-11 ENCOUNTER — Encounter: Payer: Self-pay | Admitting: *Deleted

## 2018-01-11 VITALS — BP 128/60 | HR 60 | Resp 18

## 2018-01-11 DIAGNOSIS — D45 Polycythemia vera: Secondary | ICD-10-CM

## 2018-01-11 DIAGNOSIS — J4540 Moderate persistent asthma, uncomplicated: Secondary | ICD-10-CM

## 2018-01-11 DIAGNOSIS — K219 Gastro-esophageal reflux disease without esophagitis: Secondary | ICD-10-CM | POA: Diagnosis not present

## 2018-01-11 DIAGNOSIS — J3089 Other allergic rhinitis: Secondary | ICD-10-CM | POA: Diagnosis not present

## 2018-01-11 NOTE — Progress Notes (Signed)
Follow-up Note  Referring Provider: Algis Greenhouse, MD Primary Provider: Algis Greenhouse, MD Date of Office Visit: 01/11/2018  Subjective:   Jennifer Melton (DOB: 1927-06-26) is a 84 y.o. female who returns to the [facility] on 01/11/2018 in re-evaluation of the following:  HPI: Jennifer Melton returns to this clinic in evaluation of asthma and allergic rhinitis and various other issues addressed during her initial evaluation of 28 December 2017.  She still has some cough.  She has very little shortness of breath.  She feels as though there is something in her throat.  She feels as though there is a coating in her throat and she does a lot of throat clearing.  She does not have any classic reflux symptoms.    She ended up in the emergency room for a near syncopal episode associated with dizziness and vomiting recently and it was at that point in time that she did visit with Dr. Einar Gip and had her echocardiogram which did not identify any significant amount of congestive heart failure.  She is scheduled for a stress test and a neck Doppler at some point in the near future.  She has a visit to see the hematologist at the end of this month for her polycythemia vera.  She continues to have issues with a runny nose on a consistent basis.  It does not appear as though nasal ipratropium has helped this issue.  Allergies as of 01/11/2018      Reactions   Ciprofloxacin Other (See Comments)   unknown   Penicillins Other (See Comments)   unknown   Pseudoephedrine Other (See Comments)   unknown      Medication List      albuterol (2.5 MG/3ML) 0.083% nebulizer solution Commonly known as:  PROVENTIL Take 2.5 mg by nebulization every 4 (four) hours as needed for wheezing or shortness of breath.   albuterol 108 (90 Base) MCG/ACT inhaler Commonly known as:  PROVENTIL HFA Inhale two puffs every four to six hours as needed for cough or wheeze.   cetirizine 10 MG tablet Commonly  known as:  ZYRTEC Take 10 mg by mouth daily as needed for allergies.   donepezil 10 MG tablet Commonly known as:  ARICEPT Take 10 mg by mouth daily.   DULERA 200-5 MCG/ACT Aero Generic drug:  mometasone-formoterol Inhale 2 puffs into the lungs 2 (two) times daily.   fluticasone 50 MCG/ACT nasal spray Commonly known as:  FLONASE Place 1 spray into both nostrils 2 (two) times daily.   furosemide 40 MG tablet Commonly known as:  LASIX Take 40 mg by mouth daily.   hydroxyurea 500 MG capsule Commonly known as:  HYDREA Take 500 mg by mouth daily. May take with food to minimize GI side effects.   ipratropium 0.06 % nasal spray Commonly known as:  ATROVENT Place 2 sprays into both nostrils 2 (two) times daily.   losartan 50 MG tablet Commonly known as:  COZAAR Take 50 mg by mouth daily.   metoprolol succinate 25 MG 24 hr tablet Commonly known as:  TOPROL-XL Take 1 tablet (25 mg total) by mouth daily.   montelukast 10 MG tablet Commonly known as:  SINGULAIR Take 1 tablet (10 mg total) by mouth at bedtime.   Olopatadine HCl 0.6 % Soln Place 1 spray into both nostrils 2 (two) times daily as needed (allergies).   PARoxetine 40 MG tablet Commonly known as:  PAXIL Take 40 mg by mouth daily.  Past Medical History:  Diagnosis Date  . Acute kidney injury ([place])   . Asthma   . Dementia ([place])   . High blood pressure   . LBBB (left bundle branch block)   . Polycythemia vera ([place])   . Vomiting 01/03/2018    Past Surgical History:  Procedure Laterality Date  . CYST EXCISION     Cyst removal on left breast  . HEMORRHOID SURGERY      Review of systems negative except as noted in HPI / PMHx or noted below:  Review of Systems  Constitutional: Negative.   HENT: Negative.   Eyes: Negative.   Respiratory: Negative.   Cardiovascular: Negative.   Gastrointestinal: Negative.   Genitourinary: Negative.   Musculoskeletal: Negative.   Skin: Negative.   Neurological:  Negative.   Endo/Heme/Allergies: Negative.   Psychiatric/Behavioral: Negative.      Objective:   Vitals:   01/11/18 1424  BP: 128/60  Pulse: 60  Resp: 18  SpO2: 94%          Physical Exam Constitutional:      Appearance: She is not diaphoretic.  HENT:     Head: Normocephalic.     Right Ear: Tympanic membrane, ear canal and external ear normal. There is impacted cerumen.     Left Ear: Tympanic membrane, ear canal and external ear normal. There is impacted cerumen.     Nose: Nose normal. No mucosal edema or rhinorrhea.     Mouth/Throat:     Pharynx: Uvula midline. No oropharyngeal exudate.  Eyes:     Conjunctiva/sclera: Conjunctivae normal.  Neck:     Thyroid: No thyromegaly.     Trachea: Trachea normal. No tracheal tenderness or tracheal deviation.  Cardiovascular:     Rate and Rhythm: Normal rate and regular rhythm.     Heart sounds: S1 normal and S2 normal. Murmur (Systolic) present.  Pulmonary:     Effort: No respiratory distress.     Breath sounds: No stridor. Wheezing (Bilateral expiratory wheezes) present. No rales.  Lymphadenopathy:     Head:     Right side of head: No tonsillar adenopathy.     Left side of head: No tonsillar adenopathy.     Cervical: No cervical adenopathy.  Skin:    Findings: No erythema or rash.     Nails: There is no clubbing.   Neurological:     Mental Status: She is alert.     Diagnostics:    Spirometry was performed and demonstrated an FEV1 of 1.18 at 90  % of predicted.  The patient had an Asthma Control Test with the following results: ACT Total Score: 6.    Results of an echocardiogram obtained 04 January 2018 identified the following:  - Left ventricle: The cavity size was normal. Systolic function was   normal. The estimated ejection fraction was in the range of 55%   to 60%. Wall motion was normal; there were no regional wall   motion abnormalities. Doppler parameters are consistent with   abnormal left ventricular  relaxation (grade 1 diastolic   dysfunction). - Aortic valve: There was mild regurgitation. - Mitral valve: There was mild regurgitation. - Left atrium: The atrium was mildly dilated. - Right atrium: The atrium was mildly dilated.  Results of a chest x-ray obtained 03 January 2018 identified the following:  Cardiac enlargement without heart failure. Atherosclerotic aortic arch. Lungs are clear without infiltrate or effusion.  Results of blood tests obtained for December 2019 identified creatinine 1.46 mg/DL, AST  17 IU/L, ALT 14 IU/L, BNP 43.1 PG/mL, WBC 5.0, absolute eosinophil 200, absolute lymphocyte 1000, hemoglobin 12.1, platelet 516, TSH 4.28 uIU/mL, free T4 0.99 NG/DL, no antigen specific IgE antibodies identified on an aero allergen profile with a total IgE serum level of 33 IU/mL     Assessment and Plan:   1. Not well controlled moderate persistent asthma   2. Perennial allergic rhinitis   3. Polycythemia vera ([place])   4. LPRD (laryngopharyngeal reflux disease)     1. Continue to treat inflammation:   A. Dulera 200 - 2 inhalations 2 times per day with spacer  B. Fluticasone - 1 spray each nostril 1 times per day  C. Montelukast 10mg  - 1 tablet one time per day  D. Prednisone 10mg  - 2 tablets 1 time a day for 10 days  2. Treat reflux:   A. Omeprazole 40mg  tablet in AM  B. Famotidine 40mg  tablet in PM  3. If Needed:   A. Proventil HFA - 2 inhalations every 4-6 hours  B. Duoneb nebulization every 4-6 hours ( neb today)  B. Cetirizine 10mg  - 1 tablet 1 time per day  C. Nasal ipratropium 0.06% 2 sprays each nostril every 6 hours  D. Patanase - 2 sprays each nostril 2 times per day    4. ENT evaluation for examining throat  5. Keep appointments for stress test, neck doppler, and hematologist appointment.  6. Return in 4 weeks or earlier if problem. Taper medications?  It appears that Bettystill has inflammation of her airway and we will treat her with a low-dose  systemic steroid as noted above for the next 10 days.  The question of why her airway is still inflamed may have to do more with the technique in which she was using her inhalers.  We reviewed her technique today and her technique was awful.  We instructed her the correct technique to make sure that [facility] gets down into her lung.  As well, she does have a lot of throat issues and we will have her throat evaluated by ENT and empirically treat her for LPR with therapy as noted above.  I will see her back in this clinic in 4 weeks or earlier if there is a problem.  Allena Katz, MD Allergy / Immunology [place]

## 2018-01-11 NOTE — Progress Notes (Signed)
Per Dr.Kozlow's instructions from telephone contact (01/11/18) patient came in to clinic to receive Depomedrol injection. NDC# 1146-4314-27 LOT # [id number] B Exp : 02/20 Location : Right upper outer quadrant  Dose: 80mg / ml Patient tolerated injection without problem.

## 2018-01-11 NOTE — Telephone Encounter (Signed)
Dr. Neldon Mc would like the patient seen by ENT for evaluation of her throat. Thank you,

## 2018-01-11 NOTE — Patient Instructions (Addendum)
  1. Continue to treat inflammation:   A. Dulera 200 - 2 inhalations 2 times per day with spacer  B. Fluticasone - 1 spray each nostril 1 times per day  C. Montelukast 10mg  - 1 tablet one time per day  D. Prednisone 10mg  - 2 tablets 1 time a day for 10 days  2. Treat reflux:   A. Omeprazole 40mg  tablet in AM  B. Famotidine 40mg  tablet in PM  3. If Needed:   A. Proventil HFA - 2 inhalations every 4-6 hours  B. Duoneb nebulization every 4-6 hours ( neb today)  B. Cetirizine 10mg  - 1 tablet 1 time per day  C. Nasal ipratropium 0.06% 2 sprays each nostril every 6 hours  D. Patanase - 2 sprays each nostril 2 times per day    4. ENT evaluation for examining throat  5. Keep appointments for stress test, neck doppler, and hematologist appointment.  6. Return in 4 weeks or earlier if problem. Taper medications?

## 2018-01-12 ENCOUNTER — Encounter: Payer: Self-pay | Admitting: Allergy and Immunology

## 2018-01-12 NOTE — Telephone Encounter (Signed)
Referral has been faxed to Dr MA, ENT in [place]

## 2018-01-12 NOTE — Telephone Encounter (Signed)
Thank you ma'am for all of your hard work.

## 2018-01-17 DIAGNOSIS — Z79899 Other long term (current) drug therapy: Secondary | ICD-10-CM

## 2018-01-17 DIAGNOSIS — Z7982 Long term (current) use of aspirin: Secondary | ICD-10-CM

## 2018-01-17 DIAGNOSIS — D45 Polycythemia vera: Secondary | ICD-10-CM

## 2018-01-30 ENCOUNTER — Ambulatory Visit: Payer: PRIVATE HEALTH INSURANCE | Admitting: Allergy and Immunology

## 2018-02-08 ENCOUNTER — Ambulatory Visit (INDEPENDENT_AMBULATORY_CARE_PROVIDER_SITE_OTHER): Payer: MEDICARE | Admitting: Allergy and Immunology

## 2018-02-08 ENCOUNTER — Encounter: Payer: Self-pay | Admitting: Allergy and Immunology

## 2018-02-08 VITALS — BP 110/62 | HR 62 | Resp 18

## 2018-02-08 DIAGNOSIS — J4540 Moderate persistent asthma, uncomplicated: Secondary | ICD-10-CM

## 2018-02-08 DIAGNOSIS — K219 Gastro-esophageal reflux disease without esophagitis: Secondary | ICD-10-CM

## 2018-02-08 DIAGNOSIS — D45 Polycythemia vera: Secondary | ICD-10-CM | POA: Diagnosis not present

## 2018-02-08 DIAGNOSIS — J3089 Other allergic rhinitis: Secondary | ICD-10-CM | POA: Diagnosis not present

## 2018-02-08 NOTE — Progress Notes (Signed)
Follow-up Note  Referring Provider: Algis Greenhouse, MD Primary Provider: Algis Greenhouse, MD Date of Office Visit: 02/08/2018  Subjective:   Jennifer Melton (DOB: [birth year]) is a 85 y.o. female who returns to the [facility] on 02/08/2018 in re-evaluation of the following:  HPI: Jennifer Melton returns to this clinic in evaluation of her asthma and allergic rhinitis and history of polycythemia vera and reflux induced respiratory disease.  She was last seen in this clinic on 11 January 2018.  She is much better.  She has had much less cough and the sensation of something stuck in her throat is dramatically decreased.  She has not been using a short acting bronchodilator.  There is definitely some difficulty with her using her inhalers.  During her last visit we showed her the correct technique to inhale these devices and unfortunately she still has difficulty learning this technique correctly.  She has not really been having any issues with her nose at this point.  She does not have any classic reflux symptoms.  She did visit with Dr. Bobby Rumpf, hematologist, concerning her polycythemia vera.  She will be visiting with Dr. Einar Gip for a stress test.  She will be having her ENT evaluation tomorrow.  Allergies as of 02/08/2018      Reactions   Ciprofloxacin Other (See Comments)   unknown   Penicillins Other (See Comments)   unknown   Pseudoephedrine Other (See Comments)   unknown      Medication List      albuterol (2.5 MG/3ML) 0.083% nebulizer solution Commonly known as:  PROVENTIL Take 2.5 mg by nebulization every 4 (four) hours as needed for wheezing or shortness of breath.   albuterol 108 (90 Base) MCG/ACT inhaler Commonly known as:  PROVENTIL HFA Inhale two puffs every four to six hours as needed for cough or wheeze.   cetirizine 10 MG tablet Commonly known as:  ZYRTEC Take 10 mg by mouth daily as needed for allergies.   donepezil 10 MG tablet Commonly  known as:  ARICEPT Take 10 mg by mouth daily.   DULERA 200-5 MCG/ACT Aero Generic drug:  mometasone-formoterol Inhale 2 puffs into the lungs 2 (two) times daily.   fluticasone 50 MCG/ACT nasal spray Commonly known as:  FLONASE Place 1 spray into both nostrils 2 (two) times daily.   furosemide 40 MG tablet Commonly known as:  LASIX Take 40 mg by mouth daily.   hydroxyurea 500 MG capsule Commonly known as:  HYDREA Take 500 mg by mouth daily. May take with food to minimize GI side effects.   ipratropium 0.06 % nasal spray Commonly known as:  ATROVENT Place 2 sprays into both nostrils 2 (two) times daily.   losartan 50 MG tablet Commonly known as:  COZAAR Take 50 mg by mouth daily.   metoprolol succinate 25 MG 24 hr tablet Commonly known as:  TOPROL-XL Take 1 tablet (25 mg total) by mouth daily.   montelukast 10 MG tablet Commonly known as:  SINGULAIR Take 1 tablet (10 mg total) by mouth at bedtime.   Olopatadine HCl 0.6 % Soln Place 1 spray into both nostrils 2 (two) times daily as needed (allergies).   PARoxetine 40 MG tablet Commonly known as:  PAXIL Take 40 mg by mouth daily.       Past Medical History:  Diagnosis Date  . Acute kidney injury ([place])   . Asthma   . Dementia ([place])   . High blood pressure   .  LBBB (left bundle branch block)   . Polycythemia vera ([place])   . Vomiting 01/03/2018    Past Surgical History:  Procedure Laterality Date  . CYST EXCISION     Cyst removal on left breast  . HEMORRHOID SURGERY      Review of systems negative except as noted in HPI / PMHx or noted below:  Review of Systems  Constitutional: Negative.   HENT: Negative.   Eyes: Negative.   Respiratory: Negative.   Cardiovascular: Negative.   Gastrointestinal: Negative.   Genitourinary: Negative.   Musculoskeletal: Negative.   Skin: Negative.   Neurological: Negative.   Endo/Heme/Allergies: Negative.   Psychiatric/Behavioral: Negative.      Objective:    Vitals:   02/08/18 1337  BP: 110/62  Pulse: 62  Resp: 18          Physical Exam Constitutional:      Appearance: She is not diaphoretic.  HENT:     Head: Normocephalic.     Right Ear: Tympanic membrane, ear canal and external ear normal.     Left Ear: Tympanic membrane, ear canal and external ear normal.     Nose: Nose normal. No mucosal edema or rhinorrhea.     Mouth/Throat:     Pharynx: Uvula midline. No oropharyngeal exudate.  Eyes:     Conjunctiva/sclera: Conjunctivae normal.  Neck:     Thyroid: No thyromegaly.     Trachea: Trachea normal. No tracheal tenderness or tracheal deviation.  Cardiovascular:     Rate and Rhythm: Normal rate and regular rhythm.     Heart sounds: Normal heart sounds, S1 normal and S2 normal. No murmur (Systolic).  Pulmonary:     Effort: No respiratory distress.     Breath sounds: Normal breath sounds. No stridor. No wheezing or rales.  Lymphadenopathy:     Head:     Right side of head: No tonsillar adenopathy.     Left side of head: No tonsillar adenopathy.     Cervical: No cervical adenopathy.  Skin:    Findings: No erythema or rash.     Nails: There is no clubbing.   Neurological:     Mental Status: She is alert.     Diagnostics:    Spirometry was performed and demonstrated an FEV1 of 1.15 at 91 % of predicted.  The patient had an Asthma Control Test with the following results: ACT Total Score: 20.    Assessment and Plan:   1. Not well controlled moderate persistent asthma   2. Perennial allergic rhinitis   3. LPRD (laryngopharyngeal reflux disease)   4. Polycythemia vera ([place])     1. Continue to treat inflammation:   A.  Budesonide 0.5 mg nebulized twice a day  B.  Perforomist nebulized twice a day  C.  Fluticasone - 1 spray each nostril 1 times per day  D.  Montelukast 10mg  - 1 tablet one time per day  2. Continue to Treat reflux:   A. Omeprazole 40mg  tablet in AM  B. Famotidine 40mg  tablet in PM  3. If  Needed:   A. Proventil HFA - 2 inhalations every 4-6 hours  B. Duoneb nebulization every 4-6 hours ( neb today)  B. Cetirizine 10mg  - 1 tablet 1 time per day  C. Nasal ipratropium 0.06% 2 sprays each nostril every 6 hours  D. Patanase - 2 sprays each nostril 2 times per day    4. Follow through with ENT evaluation for examining throat  5. Return in 8  weeks or earlier if problem.    I am going to have Tonette utilize nebulized anti-inflammatory agents for her lower airways as she appears to be having some significant problems utilizing MDI.  She will remain on anti-inflammatory agents for her airway and therapy directed against reflux as noted above and she has several other medication she can use on an as-needed basis.  She will follow through with ENT evaluation of her throat and cardiac evaluation with her cardiologist and continue to follow with Dr. Bobby Rumpf regarding her polycythemia vera.  I will see her back in this clinic in 8 weeks or earlier if there is a problem.  Allena Katz, MD Allergy / Immunology [place]

## 2018-02-08 NOTE — Patient Instructions (Addendum)
  1. Continue to treat inflammation:   A.  Budesonide 0.5 mg nebulized twice a day  B.  Perforomist nebulized twice a day  C.  Fluticasone - 1 spray each nostril 1 times per day  D.  Montelukast 10mg  - 1 tablet one time per day  2. Continue to Treat reflux:   A. Omeprazole 40mg  tablet in AM  B. Famotidine 40mg  tablet in PM  3. If Needed:   A. Proventil HFA - 2 inhalations every 4-6 hours  B. Duoneb nebulization every 4-6 hours   B. Cetirizine 10mg  - 1 tablet 1 time per day  C. Nasal ipratropium 0.06% 2 sprays each nostril every 6 hours  D. Patanase - 2 sprays each nostril 2 times per day    4. Follow through with ENT evaluation for examining throat  5. Return in 8 weeks or earlier if problem.

## 2018-02-09 ENCOUNTER — Encounter: Payer: Self-pay | Admitting: Allergy and Immunology

## 2018-02-15 ENCOUNTER — Telehealth: Payer: Self-pay | Admitting: Allergy and Immunology

## 2018-02-15 NOTE — Telephone Encounter (Signed)
Spoke with Jennifer Melton's daughter. The medications should be arriving soon.  We will provide additional samples until they do.

## 2018-02-15 NOTE — Telephone Encounter (Signed)
Patient's daughter called and said that some medications were going to be called in to a [facility]. She said they have not heard anything from them.

## 2018-02-22 ENCOUNTER — Ambulatory Visit (INDEPENDENT_AMBULATORY_CARE_PROVIDER_SITE_OTHER): Payer: MEDICARE | Admitting: Allergy and Immunology

## 2018-02-22 ENCOUNTER — Encounter: Payer: Self-pay | Admitting: Allergy and Immunology

## 2018-02-22 VITALS — BP 114/52 | HR 60 | Resp 14

## 2018-02-22 DIAGNOSIS — R5383 Other fatigue: Secondary | ICD-10-CM

## 2018-02-22 DIAGNOSIS — J4540 Moderate persistent asthma, uncomplicated: Secondary | ICD-10-CM

## 2018-02-22 DIAGNOSIS — J3089 Other allergic rhinitis: Secondary | ICD-10-CM | POA: Diagnosis not present

## 2018-02-22 DIAGNOSIS — K219 Gastro-esophageal reflux disease without esophagitis: Secondary | ICD-10-CM | POA: Diagnosis not present

## 2018-02-22 MED ORDER — IPRATROPIUM BROMIDE 0.06 % NA SOLN: NASAL | 5 refills | Status: AC

## 2018-02-22 MED ORDER — OMEPRAZOLE 40 MG PO CPDR: 40.0000 mg | DELAYED_RELEASE_CAPSULE | ORAL | 5 refills | Status: DC

## 2018-02-22 MED ORDER — FAMOTIDINE 40 MG PO TABS: 40.0000 mg | ORAL_TABLET | Freq: Every day | ORAL | 5 refills | Status: DC

## 2018-02-22 NOTE — Patient Instructions (Addendum)
  1. Continue to treat inflammation:   A.  Budesonide 0.5 mg nebulized twice a day  B.  Perforomist nebulized twice a day  C.  Fluticasone - 1 spray each nostril 1 times per day  D.  Montelukast 10mg  - 1 tablet one time per day  E.  Prednisone 30mg  now, then 20mg  daily for 5 days  F.  Benralizumab injection tomorrow and every 4 weeks  2. Continue to Treat reflux:   A. Omeprazole 40mg  tablet in AM  B. Famotidine 40mg  tablet in PM  3. If Needed:   A. Proventil HFA - 2 inhalations every 4-6 hours  B. Duoneb nebulization every 4-6 hours   C. Nasal ipratropium 0.06% 2 sprays each nostril every 6 hours  D. Patanase - 2 sprays each nostril 2 times per day    4. Discontinue Zyrtec / cetirizine  5. Return in 8 weeks or earlier if problem.

## 2018-02-22 NOTE — Progress Notes (Signed)
Follow-up Note  Referring Provider: Algis Greenhouse, MD Primary Provider: Algis Greenhouse, MD Date of Office Visit: 02/22/2018  Subjective:   Jennifer Melton (DOB: 07-Mar-1927) is a 85 y.o. female who returns to the [facility] on 02/22/2018 in re-evaluation of the following:  HPI: Veria returns to this clinic in reevaluation of her asthma and allergic rhinitis and reflux induced respiratory disease.  Her last visit to this clinic was 08 February 2018.  She has once again redeveloped wheezing and coughing and has been using her bronchodilator extensively over the course of the past week or so.  Her nose may be a little bit runny but otherwise no other significant upper airway symptoms.  It does appear as though her reflux is under pretty good control at this point in time.  She has undergone evaluation with hematology for her polycythemia vera, cardiology for her cardiac disease, and had ENT evaluation for her throat.   According to her daughter she has been somewhat fatigued and is laying around a lot over the course of the past month or so.  She does use cetirizine on a regular basis.  Allergies as of 02/22/2018      Reactions   Ciprofloxacin Other (See Comments)   unknown   Penicillins Other (See Comments)   unknown   Pseudoephedrine Other (See Comments)   unknown      Medication List      albuterol (2.5 MG/3ML) 0.083% nebulizer solution Commonly known as:  PROVENTIL Take 2.5 mg by nebulization every 4 (four) hours as needed for wheezing or shortness of breath.   albuterol 108 (90 Base) MCG/ACT inhaler Commonly known as:  PROVENTIL HFA Inhale two puffs every four to six hours as needed for cough or wheeze.   cetirizine 10 MG tablet Commonly known as:  ZYRTEC Take 10 mg by mouth daily as needed for allergies.   donepezil 10 MG tablet Commonly known as:  ARICEPT Take 10 mg by mouth daily.   DULERA 200-5 MCG/ACT Aero Generic drug:   mometasone-formoterol Inhale 2 puffs into the lungs 2 (two) times daily.   famotidine 40 MG tablet Commonly known as:  PEPCID Take 1 tablet (40 mg total) by mouth at bedtime.   fluticasone 50 MCG/ACT nasal spray Commonly known as:  FLONASE Place 1 spray into both nostrils 2 (two) times daily.   furosemide 40 MG tablet Commonly known as:  LASIX Take 40 mg by mouth daily.   hydroxyurea 500 MG capsule Commonly known as:  HYDREA Take 500 mg by mouth daily. May take with food to minimize GI side effects.   ipratropium 0.06 % nasal spray Commonly known as:  ATROVENT Place 2 sprays into both nostrils 2 (two) times daily.   ipratropium 0.06 % nasal spray Commonly known as:  ATROVENT Can use two sprays in each nostril every six hours as needed to dry up runny nose.   losartan 50 MG tablet Commonly known as:  COZAAR Take 50 mg by mouth daily.   metoprolol succinate 25 MG 24 hr tablet Commonly known as:  TOPROL-XL Take 1 tablet (25 mg total) by mouth daily.   montelukast 10 MG tablet Commonly known as:  SINGULAIR Take 1 tablet (10 mg total) by mouth at bedtime.   Olopatadine HCl 0.6 % Soln Place 1 spray into both nostrils 2 (two) times daily as needed (allergies).   omeprazole 40 MG capsule Commonly known as:  PRILOSEC Take 1 capsule (40 mg  total) by mouth every morning.   PARoxetine 40 MG tablet Commonly known as:  PAXIL Take 40 mg by mouth daily.       Past Medical History:  Diagnosis Date  . Acute kidney injury ([place])   . Asthma   . Dementia ([place])   . High blood pressure   . LBBB (left bundle branch block)   . Polycythemia vera ([place])   . Vomiting 01/03/2018    Past Surgical History:  Procedure Laterality Date  . CYST EXCISION     Cyst removal on left breast  . HEMORRHOID SURGERY      Review of systems negative except as noted in HPI / PMHx or noted below:  Review of Systems  Constitutional: Negative.   HENT: Negative.   Eyes: Negative.     Respiratory: Negative.   Cardiovascular: Negative.   Gastrointestinal: Negative.   Genitourinary: Negative.   Musculoskeletal: Negative.   Skin: Negative.   Neurological: Negative.   Endo/Heme/Allergies: Negative.   Psychiatric/Behavioral: Negative.      Objective:   Vitals:   02/22/18 1628  BP: (!) 114/52  Pulse: 60  Resp: 14  SpO2: 97%          Physical Exam Constitutional:      Appearance: She is not diaphoretic.  HENT:     Head: Normocephalic.     Right Ear: Tympanic membrane, ear canal and external ear normal.     Left Ear: Tympanic membrane, ear canal and external ear normal.     Nose: Nose normal. No mucosal edema or rhinorrhea.     Mouth/Throat:     Pharynx: Uvula midline. No oropharyngeal exudate.  Eyes:     Conjunctiva/sclera: Conjunctivae normal.  Neck:     Thyroid: No thyromegaly.     Trachea: Trachea normal. No tracheal tenderness or tracheal deviation.  Cardiovascular:     Rate and Rhythm: Normal rate and regular rhythm.     Heart sounds: S1 normal and S2 normal. Murmur (Systolic) present.  Pulmonary:     Effort: No respiratory distress.     Breath sounds: No stridor. Wheezing (Bilateral expiratory wheezes in all lung fields.) present. No rales.  Lymphadenopathy:     Head:     Right side of head: No tonsillar adenopathy.     Left side of head: No tonsillar adenopathy.     Cervical: No cervical adenopathy.  Skin:    Findings: No erythema or rash.     Nails: There is no clubbing.   Neurological:     Mental Status: She is alert.     Diagnostics:    Spirometry was performed and demonstrated an FEV1 of 1.10 at 87 % of predicted.  Results of an echocardiogram obtained 04 January 2018 identified the following:  - Left ventricle: The cavity size was normal. Systolic function was   normal. The estimated ejection fraction was in the range of 55%   to 60%. Wall motion was normal; there were no regional wall   motion abnormalities. Doppler  parameters are consistent with   abnormal left ventricular relaxation (grade 1 diastolic   dysfunction). - Aortic valve: There was mild regurgitation. - Mitral valve: There was mild regurgitation. - Left atrium: The atrium was mildly dilated. - Right atrium: The atrium was mildly dilated.  Assessment and Plan:   1. Not well controlled moderate persistent asthma   2. Perennial allergic rhinitis   3. LPRD (laryngopharyngeal reflux disease)   4. Other fatigue     1.  Continue to treat inflammation:   A.  Budesonide 0.5 mg nebulized twice a day  B.  Perforomist nebulized twice a day  C.  Fluticasone - 1 spray each nostril 1 times per day  D.  Montelukast 10mg  - 1 tablet one time per day  E.  Prednisone 30mg  now, then 20mg  daily for 5 days  F.  Benralizumab injection tomorrow and every 4 weeks  2. Continue to Treat reflux:   A. Omeprazole 40mg  tablet in AM  B. Famotidine 40mg  tablet in PM  3. If Needed:   A. Proventil HFA - 2 inhalations every 4-6 hours  B. Duoneb nebulization every 4-6 hours   C. Nasal ipratropium 0.06% 2 sprays each nostril every 6 hours  D. Patanase - 2 sprays each nostril 2 times per day    4. Discontinue Zyrtec  5. Return in 8 weeks or earlier if problem.    Cheyanna has failed medical therapy and we need to start her on a biological agent for her eosinophilic driven respiratory tract disease and we will get that arranged tomorrow.  In the meantime we will once again treat her with systemic steroids as noted above.  As well, she has been having some problems with fatigue and will eliminate her cetirizine as this may be contributing to this issue.  She will remain on anti-inflammatory agents for airway and therapy directed against reflux and I will see her back in this clinic in 8 weeks or earlier if there is a problem.  Allena Katz, MD Allergy / Immunology [place]

## 2018-02-23 ENCOUNTER — Encounter: Payer: Self-pay | Admitting: Allergy and Immunology

## 2018-02-23 ENCOUNTER — Other Ambulatory Visit: Payer: Self-pay

## 2018-02-23 ENCOUNTER — Ambulatory Visit (INDEPENDENT_AMBULATORY_CARE_PROVIDER_SITE_OTHER): Payer: MEDICARE | Admitting: *Deleted

## 2018-02-23 DIAGNOSIS — J4550 Severe persistent asthma, uncomplicated: Secondary | ICD-10-CM

## 2018-02-23 MED ORDER — ALBUTEROL SULFATE HFA 108 (90 BASE) MCG/ACT IN AERS: INHALATION_SPRAY | RESPIRATORY_TRACT | 1 refills | Status: DC

## 2018-02-23 MED ORDER — BENRALIZUMAB 30 MG/ML ~~LOC~~ SOSY
30.0000 mg | PREFILLED_SYRINGE | SUBCUTANEOUS | Status: DC
  Administered 2018-02-23 – 2018-05-24 (×4): 30 mg via SUBCUTANEOUS

## 2018-02-23 NOTE — Progress Notes (Signed)
Patient started [place] today. She will receive one injection every 4 weeks for the first 3 injections, then change to every 8 weeks.  Observed for one hour and had no issues.  Consent signed.

## 2018-03-01 ENCOUNTER — Telehealth: Payer: Self-pay

## 2018-03-01 NOTE — Telephone Encounter (Signed)
Daughter called letting you know that her mom is not getting any better, when she gets ready she gets sob and hot flashes, also feeling clammy; She has not been able to walk due to having a cold and asthma acting up n

## 2018-03-03 NOTE — Telephone Encounter (Signed)
I discussed with patients daughter over the phone. Will schedule for OV next week to further discuss.

## 2018-03-07 ENCOUNTER — Telehealth: Payer: Self-pay | Admitting: *Deleted

## 2018-03-07 NOTE — Telephone Encounter (Signed)
Called and talked with patient's daughter, Leda Gauze, and answered some of her questions regarding medications.  She is requesting a refill of Albuterol and a mask for her nebulizer.  Will send in new RX for both to [facility].

## 2018-03-07 NOTE — Telephone Encounter (Signed)
Patient's daughter called requesting for a nurse to return her call. Please advise [phone]

## 2018-03-07 NOTE — Telephone Encounter (Signed)
Jarrett Soho please advise. Thank You so much.

## 2018-03-08 NOTE — Telephone Encounter (Signed)
Faxed Rx for Albuterol 0.083% neb solution and neb set with mask to [facility].

## 2018-03-10 ENCOUNTER — Telehealth: Payer: Self-pay | Admitting: Allergy and Immunology

## 2018-03-10 NOTE — Telephone Encounter (Signed)
Patient's daughter called and said her mother is still having drainage and coughing. She would also like to know if she might possibly be allergic to dairy products. She seems to be having a lot of mucus. She said she would like to speak to a nurse.

## 2018-03-10 NOTE — Telephone Encounter (Signed)
Spoke with her Daughter, I did tell her that it could take up to 2-3 Fasenra injections before we will be able to tell a big difference. I also told her that if she is having a lot of mucus, she can try Mucinex DM. Based on her symptoms, it does not appear to be a food allergy since she is not having any allergic reaction symptoms.  She will call us if she has any other issues.

## 2018-03-15 ENCOUNTER — Encounter: Payer: Self-pay | Admitting: Cardiology

## 2018-03-15 ENCOUNTER — Ambulatory Visit (INDEPENDENT_AMBULATORY_CARE_PROVIDER_SITE_OTHER): Payer: MEDICARE | Admitting: Cardiology

## 2018-03-15 VITALS — BP 101/51 | HR 55 | Ht 62.5 in | Wt 134.2 lb

## 2018-03-15 DIAGNOSIS — R9439 Abnormal result of other cardiovascular function study: Secondary | ICD-10-CM | POA: Diagnosis not present

## 2018-03-15 DIAGNOSIS — R0609 Other forms of dyspnea: Secondary | ICD-10-CM

## 2018-03-15 DIAGNOSIS — I5032 Chronic diastolic (congestive) heart failure: Secondary | ICD-10-CM

## 2018-03-15 DIAGNOSIS — D45 Polycythemia vera: Secondary | ICD-10-CM

## 2018-03-15 DIAGNOSIS — I447 Left bundle-branch block, unspecified: Secondary | ICD-10-CM

## 2018-03-15 DIAGNOSIS — I509 Heart failure, unspecified: Secondary | ICD-10-CM | POA: Insufficient documentation

## 2018-03-15 DIAGNOSIS — K219 Gastro-esophageal reflux disease without esophagitis: Secondary | ICD-10-CM

## 2018-03-15 MED ORDER — ISOSORBIDE MONONITRATE ER 30 MG PO TB24: 30.0000 mg | ORAL_TABLET | Freq: Every day | ORAL | 2 refills | Status: DC

## 2018-03-15 MED ORDER — SIMVASTATIN 20 MG PO TABS: 20.0000 mg | ORAL_TABLET | Freq: Every day | ORAL | 2 refills | Status: DC

## 2018-03-15 MED ORDER — METOPROLOL SUCCINATE ER 25 MG PO TB24: 12.5000 mg | ORAL_TABLET | Freq: Every day | ORAL | 1 refills | Status: AC

## 2018-03-15 NOTE — Progress Notes (Signed)
Subjective:  Primary Physician:  Algis Greenhouse, MD  Patient ID: Jennifer Melton, female    DOB: 1927/10/30, 85 y.o.   MRN: [id number]  Chief Complaint  Patient presents with  . Fatigue    F/U    HPI: Jennifer Melton  is a 85 y.o. female  with moderate persistent asthma, GERD, hypothyroidism, polycythemia vera, due to sudden onset of episode of nausea and vomiting and lightheadedness was admitted to [facility] on 01/03/2018 and was found to have normal LVEF by echocardiogram, asymptomatic NSVT on telemetry, was started on low-dose metoprolol, symptoms felt to be vasovagal and discharged home.  Due to progressively worsening dyspnea and decreased physical activity, mild fatigue, in spite of being 85 years of age she underwent nuclear stress test on 01/20/2018 and was found to have high risk study with inferior reversible ischemia. I recommend continued medical therapy and wanted to see her back in 6 weeks for follow-up.    Patient's daughter states that she continues to have occasional episodes of nausea and vomiting and cough and does feel dizzy at this moment.  But otherwise continues to have marked fatigue and dyspnea without any wheezing and she is concerned about cardiac status.  Past Medical History:  Diagnosis Date  . Acute kidney injury ([place])   . Asthma   . Bilateral carotid bruits   . Dementia ([place])   . Dementia ([place])   . Dyspnea on exertion   . Heart murmur   . High blood pressure   . LBBB (left bundle branch block)   . Left bundle branch block   . Near syncope 01/04/2018  . Polycythemia   . Polycythemia vera ([place])   . Vomiting 01/03/2018    Past Surgical History:  Procedure Laterality Date  . CYST EXCISION     Cyst removal on left breast  . HEMORRHOID SURGERY      Social History   Socioeconomic History  . Marital status: Widowed    Spouse name: Not on file  . Number of children: 4  . Years of education: Not on file  . Highest education  level: Not on file  Occupational History  . Not on file  Social Needs  . Financial resource strain: Not on file  . Food insecurity:    Worry: Not on file    Inability: Not on file  . Transportation needs:    Medical: Not on file    Non-medical: Not on file  Tobacco Use  . Smoking status: Passive Smoke Exposure - Never Smoker  . Smokeless tobacco: Never Used  Substance and Sexual Activity  . Alcohol use: Never    Frequency: Never  . Drug use: Never  . Sexual activity: Not on file  Lifestyle  . Physical activity:    Days per week: Not on file    Minutes per session: Not on file  . Stress: Not on file  Relationships  . Social connections:    Talks on phone: Not on file    Gets together: Not on file    Attends religious service: Not on file    Active member of club or organization: Not on file    Attends meetings of clubs or organizations: Not on file    Relationship status: Not on file  . Intimate partner violence:    Fear of current or ex partner: Not on file    Emotionally abused: Not on file    Physically abused: Not on file  Forced sexual activity: Not on file  Other Topics Concern  . Not on file  Social History Narrative  . Not on file    Current Outpatient Medications on File Prior to Visit  Medication Sig Dispense Refill  . albuterol (PROVENTIL HFA) 108 (90 Base) MCG/ACT inhaler Inhale two puffs every four to six hours as needed for cough or wheeze. 1 Inhaler 1  . albuterol (PROVENTIL) (2.5 MG/3ML) 0.083% nebulizer solution Take 2.5 mg by nebulization every 4 (four) hours as needed for wheezing or shortness of breath.    Marland Kitchen aspirin EC 81 MG tablet Take 81 mg by mouth daily.    Marland Kitchen donepezil (ARICEPT) 10 MG tablet Take 10 mg by mouth daily.    . famotidine (PEPCID) 40 MG tablet Take 1 tablet (40 mg total) by mouth at bedtime. 30 tablet 5  . fluticasone (FLONASE) 50 MCG/ACT nasal spray Place 1 spray into both nostrils 2 (two) times daily.    . hydroxyurea (HYDREA)  500 MG capsule Take 500 mg by mouth daily. May take with food to minimize GI side effects.  1 capsule by mouth two times daily on Monday, Wednesday, and Friday. 1 capsule every other day once daily.    Marland Kitchen ipratropium (ATROVENT) 0.06 % nasal spray Place 2 sprays into both nostrils 2 (two) times daily.    Marland Kitchen ipratropium (ATROVENT) 0.06 % nasal spray Can use two sprays in each nostril every six hours as needed to dry up runny nose. 15 mL 5  . losartan (COZAAR) 50 MG tablet Take 50 mg by mouth daily.    . montelukast (SINGULAIR) 10 MG tablet Take 1 tablet (10 mg total) by mouth at bedtime. 90 tablet 1  . Olopatadine HCl 0.6 % SOLN Place 1 spray into both nostrils 2 (two) times daily as needed (allergies).     Marland Kitchen omeprazole (PRILOSEC) 40 MG capsule Take 1 capsule (40 mg total) by mouth every morning. 30 capsule 5  . PARoxetine (PAXIL) 40 MG tablet Take 40 mg by mouth daily.    . cetirizine (ZYRTEC) 10 MG tablet Take 10 mg by mouth daily as needed for allergies.     . mometasone-formoterol (DULERA) 200-5 MCG/ACT AERO Inhale 2 puffs into the lungs 2 (two) times daily.     Current Facility-Administered Medications on File Prior to Visit  Medication Dose Route Frequency Provider Last Rate Last Dose  . Benralizumab SOSY 30 mg  30 mg Subcutaneous Q28 days Jiles Prows, MD   30 mg at 02/23/18 1431     Review of Systems  Constitutional: Positive for malaise/fatigue. Negative for weight loss.  Respiratory: Positive for shortness of breath. Negative for cough and hemoptysis.   Cardiovascular: Negative for chest pain, palpitations, claudication and leg swelling.  Gastrointestinal: Negative for abdominal pain, blood in stool, constipation, heartburn and vomiting.  Genitourinary: Negative for dysuria.  Musculoskeletal: Negative for joint pain and myalgias.  Neurological: Negative for dizziness, focal weakness and headaches.  Endo/Heme/Allergies: Does not bruise/bleed easily.  Psychiatric/Behavioral:  Negative for depression. The patient is not nervous/anxious.   All other systems reviewed and are negative.      Objective:  Blood pressure (!) 101/51, pulse (!) 55, height 5' 2.5" (1.588 m), weight 134 lb 3.2 oz (60.9 kg), SpO2 93 %. Body mass index is 24.15 kg/m.   Physical Exam  Constitutional: She appears well-developed. No distress.  Petite  HENT:  Head: Atraumatic.  Eyes: Conjunctivae are normal.  Neck: Neck supple. No JVD present. No  thyromegaly present.  Cardiovascular: Normal rate, regular rhythm, normal heart sounds and intact distal pulses. Exam reveals no gallop.  No murmur heard. Pulses:      Carotid pulses are 2+ on the right side with bruit and 2+ on the left side with bruit.      Radial pulses are 2+ on the right side and 2+ on the left side.       Femoral pulses are 2+ on the right side and 2+ on the left side.      Popliteal pulses are 2+ on the right side and 2+ on the left side.       Dorsalis pedis pulses are 2+ on the right side and 2+ on the left side.       Posterior tibial pulses are 2+ on the right side and 2+ on the left side.  Bilateral varicose veins noted.  Pulmonary/Chest: Effort normal and breath sounds normal.  Abdominal: Soft. Bowel sounds are normal.  Musculoskeletal: Normal range of motion.        General: No edema.  Neurological: She is alert.  Skin: Skin is warm and dry.  Psychiatric: She has a normal mood and affect.   CARDIAC STUDIES:   Echo at [facility] 12/31/2017:  Left ventricle: The cavity size was normal. Systolic function was normal. The estimated ejection fraction was in the range of 55% to 60%. Wall motion was normal; there were no regional wall motion abnormalities. Doppler parameters are consistent with abnormal left ventricular relaxation (grade 1 diastolic dysfunction). - Aortic valve: There was mild regurgitation. - Mitral valve: There was mild regurgitation. - Left atrium: The atrium was mildly dilated. - Right atrium:  The atrium was mildly dilated.  Carotid Doppler  Carotid artery duplex 01/23/2018: No hemodynamically significant arterial disease in the right internal carotid artery. No hemodynamically significant arterial disease in the left internal carotid artery. Minimal plaque noted. Antegrade right vertebral artery flow. Antegrade left vertebral artery flow.  Nuclear stress test  Lexiscan myoview stress test 01/20/2018: 1. Lexiscan stress test was performed. Exercise capacity was not assessed. Stress symptoms included headache. Blood pressure was normal. The resting and stress electrocardiogram demonstrated normal sinus rhythm, first degree AV block, LBBB, occasional VPC and nonspecific ST-T changes in inferolateral leads. Stress EKG is non diagnostic for ischemia as it is a pharmacologic stress. 2. The overall quality of the study is good. Left ventricular cavity is enlarged on the rest and stress studies. Gated SPECT images reveal normal myocardial thickening and wall motion. The left ventricular ejection fraction was calculated 33%, although visually appears normal. SPECT images reveal very small sized, mild intensity, mildly reversible perfusion defect in basal inferior/inferoseptal myocardium. 3. High risk study due to reduced LVEF. Clinical correlation recommended.  Assessment & Recommendations:   1. Dyspnea on exertion  2. Chronic diastolic congestive heart failure ([place])  3. Abnormal nuclear stress test  4. Polycythemia vera ([place])  5. Gastroesophageal reflux disease without esophagitis  6.  Chronic left bundle branch block. EKG 01/02/2018: Sinus rhythm with first-degree AV block at rate of 68 bpm, left atrial enlargement, left bundle branch block.  No further analysis.   Recommendation: Patient is here on a 6 week follow-up visit of abnormal nuclear stress test, dyspnea on exertion, fatigue.  On her last office visit, we're started on metoprolol succinate 25 mg daily.  Due to low heart  rate, advanced age, I reduce the dose to 12.5 mg daily.  We will continue to monitor her heart rate.  Symptoms of GERD could also be angina pectoris and also marked dyspnea with exertional activity although has bronchial asthma, lung examination is clear.  She has not used any inhalers for asthma, so dyspnea may be related to underlying coronary artery disease in view of high risk stress test.  Due to advanced age, I have extensively discussed with the patient and her daughter regarding potential risk for cardioembolic phenomena specifically with cardiac catheterization.  I would like to try medical therapy, I started her on simvastatin 20 mg in the evening and also will add isosorbide mononitrate 30 mg daily.  If her symptoms do not improve over the next 4-6 weeks then we'll consider cardiac catheterization.   She does have chronic diastolic heart failure.  Was on chronic furosemide, which are discontinued.  Also her blood pressure is low.  She has previously has had stage III chronic kidney disease.  Hopefully this will also help with improving her renal function by discontinued furosemide.  I have discussed with the patient's daughter regarding risk of coronary angiography and potential stroke and significant change in her overall health status.  Adrian Prows, MD, [facility] 03/15/2018, 12:45 PM [facility] [place] Pager: [phone] Office: [phone] If no answer Cell [phone]

## 2018-03-20 DIAGNOSIS — Z79899 Other long term (current) drug therapy: Secondary | ICD-10-CM | POA: Diagnosis not present

## 2018-03-20 DIAGNOSIS — D45 Polycythemia vera: Secondary | ICD-10-CM

## 2018-03-20 DIAGNOSIS — Z7982 Long term (current) use of aspirin: Secondary | ICD-10-CM | POA: Diagnosis not present

## 2018-03-22 DIAGNOSIS — J4550 Severe persistent asthma, uncomplicated: Secondary | ICD-10-CM | POA: Diagnosis not present

## 2018-03-23 ENCOUNTER — Ambulatory Visit (INDEPENDENT_AMBULATORY_CARE_PROVIDER_SITE_OTHER): Payer: MEDICARE | Admitting: *Deleted

## 2018-03-23 DIAGNOSIS — J4550 Severe persistent asthma, uncomplicated: Secondary | ICD-10-CM | POA: Diagnosis not present

## 2018-03-28 ENCOUNTER — Ambulatory Visit (INDEPENDENT_AMBULATORY_CARE_PROVIDER_SITE_OTHER): Payer: MEDICARE | Admitting: Cardiology

## 2018-03-28 ENCOUNTER — Encounter: Payer: Self-pay | Admitting: Cardiology

## 2018-03-28 VITALS — BP 135/57 | HR 66 | Ht 63.0 in | Wt 137.8 lb

## 2018-03-28 DIAGNOSIS — I5032 Chronic diastolic (congestive) heart failure: Secondary | ICD-10-CM | POA: Diagnosis not present

## 2018-03-28 DIAGNOSIS — K219 Gastro-esophageal reflux disease without esophagitis: Secondary | ICD-10-CM | POA: Diagnosis not present

## 2018-03-28 DIAGNOSIS — R0609 Other forms of dyspnea: Secondary | ICD-10-CM

## 2018-03-28 DIAGNOSIS — R9439 Abnormal result of other cardiovascular function study: Secondary | ICD-10-CM | POA: Diagnosis not present

## 2018-03-28 DIAGNOSIS — I447 Left bundle-branch block, unspecified: Secondary | ICD-10-CM

## 2018-03-28 NOTE — Progress Notes (Signed)
Subjective:  Primary Physician:  Algis Greenhouse, MD  Patient ID: Jennifer Melton, female    DOB: [birth year], 85 y.o.   MRN: [id number]  Chief Complaint  Patient presents with  . Shortness of Breath    6 week F/U  . Chest Pain  . Nausea    HPI: Jennifer Melton  is a 85 y.o. female  with moderate persistent asthma, GERD, hypothyroidism, polycythemia vera, due to sudden onset of episode of nausea and vomiting and lightheadedness was admitted to [facility] on 01/03/2018 and was found to have normal LVEF by echocardiogram, asymptomatic NSVT on telemetry, was started on low-dose metoprolol, symptoms felt to be vasovagal and discharged home.  Due to progressively worsening dyspnea and decreased physical activity, mild fatigue, in spite of being 85 years of age she underwent nuclear stress test on 01/20/2018 and was found to have high risk study with mild intensity inferior reversible ischemia. Due to high risk for cardioembolic phenomenon, I had recommended medical therapy unless she does not have improvement in symptoms. She was started on statin and Imdur at her office visit 2 weeks ago. Advised to use lasix as needed.  This was a previously scheduled office visit; however, daughter wanted to keep the appt due to persistent symptoms. Patient does have dementia; therefore, her daughter is providing the majority of the information. She reports that the patient continues to have fatigue and dyspnea on exertion. She has history of asthma that is being treated by Dr. Neldon Mc and reports she has intermittent episodes of shortness of breath with associated wheezing that occur both at night or during the day and can occur with rest. Daughter states that she cannot tell much difference in her symptoms after using the nebulizer; however, patient does state that she thinks it helps. Denies any chest pain. Daughter is concerned that this is related to her heart.   She also has occasional episodes  of nausea and vomiting and cough. Has history of GERD.   Past Medical History:  Diagnosis Date  . Acute kidney injury ([place])   . Asthma   . Bilateral carotid bruits   . Dementia ([place])   . Dementia ([place])   . Dyspnea on exertion   . Heart murmur   . High blood pressure   . LBBB (left bundle branch block)   . Left bundle branch block   . Near syncope 01/04/2018  . Polycythemia   . Polycythemia vera ([place])   . Vomiting 01/03/2018    Past Surgical History:  Procedure Laterality Date  . CYST EXCISION     Cyst removal on left breast  . HEMORRHOID SURGERY      Social History   Socioeconomic History  . Marital status: Widowed    Spouse name: Not on file  . Number of children: 4  . Years of education: Not on file  . Highest education level: Not on file  Occupational History  . Not on file  Social Needs  . Financial resource strain: Not on file  . Food insecurity:    Worry: Not on file    Inability: Not on file  . Transportation needs:    Medical: Not on file    Non-medical: Not on file  Tobacco Use  . Smoking status: Passive Smoke Exposure - Never Smoker  . Smokeless tobacco: Never Used  Substance and Sexual Activity  . Alcohol use: Never    Frequency: Never  . Drug use: Never  . Sexual  activity: Not on file  Lifestyle  . Physical activity:    Days per week: Not on file    Minutes per session: Not on file  . Stress: Not on file  Relationships  . Social connections:    Talks on phone: Not on file    Gets together: Not on file    Attends religious service: Not on file    Active member of club or organization: Not on file    Attends meetings of clubs or organizations: Not on file    Relationship status: Not on file  . Intimate partner violence:    Fear of current or ex partner: Not on file    Emotionally abused: Not on file    Physically abused: Not on file    Forced sexual activity: Not on file  Other Topics Concern  . Not on file  Social History Narrative    . Not on file    Current Outpatient Medications on File Prior to Visit  Medication Sig Dispense Refill  . albuterol (PROVENTIL HFA) 108 (90 Base) MCG/ACT inhaler Inhale two puffs every four to six hours as needed for cough or wheeze. 1 Inhaler 1  . albuterol (PROVENTIL) (2.5 MG/3ML) 0.083% nebulizer solution Take 2.5 mg by nebulization every 4 (four) hours as needed for wheezing or shortness of breath.    Marland Kitchen aspirin EC 81 MG tablet Take 81 mg by mouth daily.    . cetirizine (ZYRTEC) 10 MG tablet Take 10 mg by mouth daily as needed for allergies.     Marland Kitchen donepezil (ARICEPT) 10 MG tablet Take 10 mg by mouth daily.    . famotidine (PEPCID) 40 MG tablet Take 1 tablet (40 mg total) by mouth at bedtime. 30 tablet 5  . fluticasone (FLONASE) 50 MCG/ACT nasal spray Place 1 spray into both nostrils 2 (two) times daily.    . hydroxyurea (HYDREA) 500 MG capsule Take 500 mg by mouth daily. May take with food to minimize GI side effects.  1 capsule by mouth two times daily on Monday, Wednesday, and Friday. 1 capsule every other day once daily.    Marland Kitchen ipratropium (ATROVENT) 0.06 % nasal spray Place 2 sprays into both nostrils 2 (two) times daily.    Marland Kitchen ipratropium (ATROVENT) 0.06 % nasal spray Can use two sprays in each nostril every six hours as needed to dry up runny nose. 15 mL 5  . isosorbide mononitrate (IMDUR) 30 MG 24 hr tablet Take 1 tablet (30 mg total) by mouth daily. 30 tablet 2  . losartan (COZAAR) 50 MG tablet Take 50 mg by mouth daily.    . metoprolol succinate (TOPROL-XL) 25 MG 24 hr tablet Take 0.5 tablets (12.5 mg total) by mouth daily. 45 tablet 1  . mometasone-formoterol (DULERA) 200-5 MCG/ACT AERO Inhale 2 puffs into the lungs 2 (two) times daily.    . montelukast (SINGULAIR) 10 MG tablet Take 1 tablet (10 mg total) by mouth at bedtime. 90 tablet 1  . Olopatadine HCl 0.6 % SOLN Place 1 spray into both nostrils 2 (two) times daily as needed (allergies).     Marland Kitchen omeprazole (PRILOSEC) 40 MG  capsule Take 1 capsule (40 mg total) by mouth every morning. 30 capsule 5  . PARoxetine (PAXIL) 40 MG tablet Take 40 mg by mouth daily.    . simvastatin (ZOCOR) 20 MG tablet Take 1 tablet (20 mg total) by mouth at bedtime. 30 tablet 2   Current Facility-Administered Medications on File Prior to Visit  Medication Dose Route Frequency Provider Last Rate Last Dose  . Benralizumab SOSY 30 mg  30 mg Subcutaneous Q28 days Jiles Prows, MD   30 mg at 03/23/18 1433     Review of Systems  Constitutional: Positive for malaise/fatigue. Negative for weight loss.  Respiratory: Positive for cough, shortness of breath and wheezing. Negative for hemoptysis.   Cardiovascular: Negative for chest pain, palpitations, claudication and leg swelling.  Gastrointestinal: Negative for abdominal pain, blood in stool, constipation, heartburn and vomiting.  Genitourinary: Negative for dysuria.  Musculoskeletal: Negative for joint pain and myalgias.  Neurological: Negative for dizziness, focal weakness and headaches.  Endo/Heme/Allergies: Does not bruise/bleed easily.  Psychiatric/Behavioral: Negative for depression. The patient is not nervous/anxious.   All other systems reviewed and are negative.      Objective:  Blood pressure (!) 135/57, pulse 66, height 5\' 3"  (1.6 m), weight 137 lb 12.8 oz (62.5 kg), SpO2 97 %. Body mass index is 24.41 kg/m.   Physical Exam  Constitutional: She appears well-developed. No distress.  Petite  HENT:  Head: Atraumatic.  Eyes: Conjunctivae are normal.  Neck: Neck supple. No JVD present. No thyromegaly present.  Cardiovascular: Normal rate, regular rhythm, normal heart sounds and intact distal pulses. Exam reveals no gallop.  No murmur heard. Pulses:      Carotid pulses are 2+ on the right side with bruit and 2+ on the left side with bruit.      Radial pulses are 2+ on the right side and 2+ on the left side.       Femoral pulses are 2+ on the right side and 2+ on the left  side.      Popliteal pulses are 2+ on the right side and 2+ on the left side.       Dorsalis pedis pulses are 2+ on the right side and 2+ on the left side.       Posterior tibial pulses are 2+ on the right side and 2+ on the left side.  Bilateral varicose veins noted.  Pulmonary/Chest: Effort normal and breath sounds normal.  Abdominal: Soft. Bowel sounds are normal.  Musculoskeletal: Normal range of motion.        General: No edema.  Neurological: She is alert.  Skin: Skin is warm and dry.  Psychiatric: She has a normal mood and affect.   CARDIAC STUDIES:   Echo at [facility] 12/31/2017:  Left ventricle: The cavity size was normal. Systolic function was normal. The estimated ejection fraction was in the range of 55% to 60%. Wall motion was normal; there were no regional wall motion abnormalities. Doppler parameters are consistent with abnormal left ventricular relaxation (grade 1 diastolic dysfunction). - Aortic valve: There was mild regurgitation. - Mitral valve: There was mild regurgitation. - Left atrium: The atrium was mildly dilated. - Right atrium: The atrium was mildly dilated.  Carotid Doppler  Carotid artery duplex 01/23/2018: No hemodynamically significant arterial disease in the right internal carotid artery. No hemodynamically significant arterial disease in the left internal carotid artery. Minimal plaque noted. Antegrade right vertebral artery flow. Antegrade left vertebral artery flow.  Nuclear stress test  Lexiscan myoview stress test 01/20/2018: 1. Lexiscan stress test was performed. Exercise capacity was not assessed. Stress symptoms included headache. Blood pressure was normal. The resting and stress electrocardiogram demonstrated normal sinus rhythm, first degree AV block, LBBB, occasional VPC and nonspecific ST-T changes in inferolateral leads. Stress EKG is non diagnostic for ischemia as it is a pharmacologic stress. 2.  The overall quality of the study is good.  Left ventricular cavity is enlarged on the rest and stress studies. Gated SPECT images reveal normal myocardial thickening and wall motion. The left ventricular ejection fraction was calculated 33%, although visually appears normal. SPECT images reveal very small sized, mild intensity, mildly reversible perfusion defect in basal inferior/inferoseptal myocardium. 3. High risk study due to reduced LVEF. Clinical correlation recommended.  Assessment & Recommendations:   1. Dyspnea on exertion  2. Chronic diastolic congestive heart failure ([place])  3. Abnormal nuclear stress test  4. Gastroesophageal reflux disease without esophagitis  5.  Left bundle branch block. EKG 01/02/2018: Sinus rhythm with first-degree AV block at rate of 68 bpm, left atrial enlargement, left bundle branch block.  No further analysis.   Recommendation: Patient is here on a 2 week follow-up visit of abnormal nuclear stress test, dyspnea on exertion, and fatigue.  Medical management has been recommended in view of her advanced age and high risk for cardioembolic phenomena unless persistent symptoms.   She continues to have symptoms despite starting medical therapy. I have again discussed stress test findings with the patient and her daughter along with risk of performing heart catheterization. I would prefer to avoid catheterization unless symptoms are significant and her quality of life is affected. As she only had small, mild intensity perfusion abnormality on stress test, I am unsure that all of her symptoms are angina equivalent. I feel that it would be worthwhile to obtain evaluation from pulmonary for her shortness of breath and wheezing as possible etiology before performing possibly performing catheterization. As stated above, would prefer to avoid procedure if possible. Patient and her daughter are in agreeable to this and will place pulmonary referral today.   She will continue with current medical therapy. Blood  pressure and heart rate are stable. Has chronic left bundle branch block that was noted on previous EKG.  She does have chronic diastolic heart failure. Was on furosemide previously that has now been discontinued. She has history of CKD stage 3 that will hopefully improve with discontinuing daily use of lasix. I have encouraged them to use lasix on a as needed basis. Discussed indications for this. I will see her back in 6 weeks for follow up on her symptoms, but encouraged her to contact me sooner if needed.   *I have discussed this case with Dr. Einar Gip and he personally examined the patient and participated in formulating the plan.*   Miquel Dunn, FNP-C 03/28/2018, 3:53 PM [facility]. [place] Office: [phone]

## 2018-03-29 ENCOUNTER — Encounter: Payer: Self-pay | Admitting: Cardiology

## 2018-03-30 ENCOUNTER — Encounter: Payer: Self-pay | Admitting: Allergy and Immunology

## 2018-03-30 ENCOUNTER — Ambulatory Visit (INDEPENDENT_AMBULATORY_CARE_PROVIDER_SITE_OTHER): Payer: MEDICARE | Admitting: Allergy and Immunology

## 2018-03-30 VITALS — BP 108/52 | HR 60 | Resp 20

## 2018-03-30 DIAGNOSIS — J3089 Other allergic rhinitis: Secondary | ICD-10-CM | POA: Diagnosis not present

## 2018-03-30 DIAGNOSIS — D45 Polycythemia vera: Secondary | ICD-10-CM | POA: Diagnosis not present

## 2018-03-30 DIAGNOSIS — J4550 Severe persistent asthma, uncomplicated: Secondary | ICD-10-CM | POA: Diagnosis not present

## 2018-03-30 DIAGNOSIS — K219 Gastro-esophageal reflux disease without esophagitis: Secondary | ICD-10-CM | POA: Diagnosis not present

## 2018-03-30 NOTE — Patient Instructions (Addendum)
  1. Continue to treat inflammation:   A.  Budesonide 0.5 mg nebulized twice a day  B.  Perforomist nebulized twice a day  C.  Fluticasone - 1 spray each nostril 1 times per day  D.  Montelukast 10mg  - 1 tablet one time per day  E.  Benralizumab injection every 4 weeks  2. Continue to Treat reflux:   A. Omeprazole 40mg  tablet twice a day  B. Famotidine 40mg  tablet in PM  3. If Needed:   A. Proventil HFA - 2 inhalations every 4-6 hours  B. Duoneb nebulization every 4-6 hours   C. Nasal ipratropium 0.06% 2 sprays each nostril every 6 hours  D. Patanase - 2 sprays each nostril 2 times per day    4.  Continue on Aricept?  5.  Proceed with evaluation with pulmonologist  6. Return in 8 weeks or earlier if problem.

## 2018-03-30 NOTE — Progress Notes (Signed)
- [place] [facility] [place]   Follow-up Note  Referring Provider: Algis Greenhouse, MD Primary Provider: Algis Greenhouse, MD Date of Office Visit: 03/30/2018  Subjective:   Jennifer Melton (DOB: 1927-06-05) is a 85 y.o. female who returns to the [place] on 03/30/2018 in re-evaluation of the following:  HPI: [name] returns to this clinic in reevaluation of recurrent respiratory tract symptoms presently called asthma and allergic rhinitis and reflux induced respiratory disease.  Her last visit to this clinic was 22 February 2018.  Although she is better regarding her recurrent wheezing and coughing she still continues to have some intermittent coughing spells associated with emesis.  She does not really relate any shortness of breath or chest tightness but she is not particularly very active.  She occasionally uses a bronchodilator maybe averaging out to a few times per week.  She continues on a large collection of medical therapy directed against inflammation including the use of benralizumab injections.  She has undergone evaluation with cardiology and found to have a abnormal stress test although there is not a movement forward to have her undergo cardiac catheterization or coronary artery manipulation at this point.  Apparently her heart function is pretty good based upon a recent echocardiogram.  When she was last seen in this clinic she did have some fatigue and we discontinued her cetirizine and this did not really help very much.  It should be noted that she is using Aricept.  Her daughter does not think that the Aricept has helped her at all regarding her memory issue.    Allergies as of 03/30/2018      Reactions   Ciprofloxacin Other (See Comments)   unknown   Penicillins Other (See Comments)   unknown   Pseudoephedrine Other (See Comments)   unknown      Medication List      albuterol (2.5 MG/3ML) 0.083% nebulizer  solution Commonly known as:  PROVENTIL Take 2.5 mg by nebulization every 4 (four) hours as needed for wheezing or shortness of breath.   albuterol 108 (90 Base) MCG/ACT inhaler Commonly known as:  PROVENTIL HFA Inhale two puffs every four to six hours as needed for cough or wheeze.   aspirin EC 81 MG tablet Take 81 mg by mouth daily.   cetirizine 10 MG tablet Commonly known as:  ZYRTEC Take 10 mg by mouth daily as needed for allergies.   donepezil 10 MG tablet Commonly known as:  ARICEPT Take 10 mg by mouth daily.   famotidine 40 MG tablet Commonly known as:  PEPCID Take 1 tablet (40 mg total) by mouth at bedtime.   fluticasone 50 MCG/ACT nasal spray Commonly known as:  FLONASE Place 1 spray into both nostrils 2 (two) times daily.   hydroxyurea 500 MG capsule Commonly known as:  HYDREA Take 500 mg by mouth daily. May take with food to minimize GI side effects.  1 capsule by mouth two times daily on Monday, Wednesday, and Friday. 1 capsule every other day once daily.   ipratropium 0.06 % nasal spray Commonly known as:  ATROVENT Can use two sprays in each nostril every six hours as needed to dry up runny nose.   isosorbide mononitrate 30 MG 24 hr tablet Commonly known as:  IMDUR Take 1 tablet (30 mg total) by mouth daily.   losartan 50 MG tablet Commonly known as:  COZAAR Take 50 mg by mouth daily.   Melatonin 10  MG Tbdp Take by mouth at bedtime.   metoprolol succinate 25 MG 24 hr tablet Commonly known as:  TOPROL-XL Take 0.5 tablets (12.5 mg total) by mouth daily.   montelukast 10 MG tablet Commonly known as:  SINGULAIR Take 1 tablet (10 mg total) by mouth at bedtime.   Olopatadine HCl 0.6 % Soln Place 1 spray into both nostrils 2 (two) times daily as needed (allergies).   omeprazole 40 MG capsule Commonly known as:  PRILOSEC Take 1 capsule (40 mg total) by mouth every morning.   PARoxetine 40 MG tablet Commonly known as:  PAXIL Take 40 mg by mouth  daily.   simvastatin 20 MG tablet Commonly known as:  ZOCOR Take 1 tablet (20 mg total) by mouth at bedtime.       Past Medical History:  Diagnosis Date  . Acute kidney injury ([place])   . Asthma   . Bilateral carotid bruits   . Dementia ([place])   . Dementia ([place])   . Dyspnea on exertion   . Heart murmur   . High blood pressure   . LBBB (left bundle branch block)   . Left bundle branch block   . Near syncope 01/04/2018  . Polycythemia   . Polycythemia vera ([place])   . Vomiting 01/03/2018    Past Surgical History:  Procedure Laterality Date  . CYST EXCISION     Cyst removal on left breast  . HEMORRHOID SURGERY      Review of systems negative except as noted in HPI / PMHx or noted below:  Review of Systems  Constitutional: Negative.   HENT: Negative.   Eyes: Negative.   Respiratory: Negative.   Cardiovascular: Negative.   Gastrointestinal: Negative.   Genitourinary: Negative.   Musculoskeletal: Negative.   Skin: Negative.   Neurological: Negative.   Endo/Heme/Allergies: Negative.   Psychiatric/Behavioral: Negative.      Objective:   Vitals:   03/30/18 1105  BP: (!) 108/52  Pulse: 60  Resp: 20  SpO2: 96%          Physical Exam Constitutional:      Appearance: She is not diaphoretic.  HENT:     Head: Normocephalic.     Right Ear: Tympanic membrane, ear canal and external ear normal.     Left Ear: Tympanic membrane, ear canal and external ear normal.     Nose: Nose normal. No mucosal edema or rhinorrhea.     Mouth/Throat:     Pharynx: Uvula midline. No oropharyngeal exudate.  Eyes:     Conjunctiva/sclera: Conjunctivae normal.  Neck:     Thyroid: No thyromegaly.     Trachea: Trachea normal. No tracheal tenderness or tracheal deviation.  Cardiovascular:     Rate and Rhythm: Normal rate and regular rhythm.     Heart sounds: S1 normal and S2 normal. Murmur (Systolic) present.  Pulmonary:     Effort: No respiratory distress.     Breath sounds: Normal  breath sounds. No stridor. No wheezing or rales.  Lymphadenopathy:     Head:     Right side of head: No tonsillar adenopathy.     Left side of head: No tonsillar adenopathy.     Cervical: No cervical adenopathy.  Skin:    Findings: No erythema or rash.     Nails: There is no clubbing.   Neurological:     Mental Status: She is alert.     Diagnostics: none  Assessment and Plan:   1. Severe persistent asthma without complication  2. Perennial allergic rhinitis   3. LPRD (laryngopharyngeal reflux disease)   4. Polycythemia vera ([place])     1. Continue to treat inflammation:   A.  Budesonide 0.5 mg nebulized twice a day  B.  Perforomist nebulized twice a day  C.  Fluticasone - 1 spray each nostril 1 times per day  D.  Montelukast 10mg  - 1 tablet one time per day  E.  Benralizumab injection every 4 weeks  2. Continue to Treat reflux:   A. Omeprazole 40mg  tablet twice a day  B. Famotidine 40mg  tablet in PM  3. If Needed:   A. Proventil HFA - 2 inhalations every 4-6 hours  B. Duoneb nebulization every 4-6 hours   C. Nasal ipratropium 0.06% 2 sprays each nostril every 6 hours  D. Patanase - 2 sprays each nostril 2 times per day    4.  Continue on Aricept?  5.  Proceed with evaluation with pulmonologist  6. Return in 8 weeks or earlier if problem.    Tericka appears to be stable from a respiratory standpoint.  She will undergo further evaluation regarding her respiratory tract issues with pulmonology.  She will remain on a combination of therapy directed against inflammation and reflux as noted above.  Concerning her Aricept use, her daughter does not really think that this has helped her memory at all and there is a question of whether or not this is giving rise to some fatigue.  She could discontinue Aricept and see what occurs regarding her fatigue and memory with the understanding that sometimes there is a very significant rebound cognitive defect that can occur with  discontinuing Aricept.  I will see her back in this clinic in 8 weeks or earlier if there is a problem.  Allena Katz, MD Allergy / Immunology [place]

## 2018-04-03 ENCOUNTER — Encounter: Payer: Self-pay | Admitting: Allergy and Immunology

## 2018-04-05 ENCOUNTER — Ambulatory Visit: Payer: PRIVATE HEALTH INSURANCE | Admitting: Allergy and Immunology

## 2018-04-12 ENCOUNTER — Ambulatory Visit: Payer: PRIVATE HEALTH INSURANCE | Admitting: Cardiology

## 2018-04-19 ENCOUNTER — Ambulatory Visit: Payer: Medicare Other | Admitting: Allergy and Immunology

## 2018-04-19 DIAGNOSIS — J4550 Severe persistent asthma, uncomplicated: Secondary | ICD-10-CM

## 2018-04-20 ENCOUNTER — Ambulatory Visit (INDEPENDENT_AMBULATORY_CARE_PROVIDER_SITE_OTHER): Payer: MEDICARE | Admitting: *Deleted

## 2018-04-20 ENCOUNTER — Other Ambulatory Visit: Payer: Self-pay

## 2018-04-20 DIAGNOSIS — J4550 Severe persistent asthma, uncomplicated: Secondary | ICD-10-CM | POA: Diagnosis not present

## 2018-05-08 ENCOUNTER — Ambulatory Visit (INDEPENDENT_AMBULATORY_CARE_PROVIDER_SITE_OTHER): Payer: MEDICARE | Admitting: Cardiology

## 2018-05-08 ENCOUNTER — Other Ambulatory Visit: Payer: Self-pay

## 2018-05-08 ENCOUNTER — Encounter: Payer: Self-pay | Admitting: Cardiology

## 2018-05-08 VITALS — BP 124/62 | HR 67 | Ht 63.0 in | Wt 136.0 lb

## 2018-05-08 DIAGNOSIS — R0609 Other forms of dyspnea: Secondary | ICD-10-CM | POA: Diagnosis not present

## 2018-05-08 DIAGNOSIS — I447 Left bundle-branch block, unspecified: Secondary | ICD-10-CM

## 2018-05-08 DIAGNOSIS — R9439 Abnormal result of other cardiovascular function study: Secondary | ICD-10-CM

## 2018-05-08 NOTE — Progress Notes (Signed)
Subjective:  Primary Physician:  Algis Greenhouse, MD  Patient ID: Jennifer Melton, female    DOB: [birth year], 85 y.o.   MRN: [id number]  Chief Complaint  Patient presents with  . Shortness of Breath    HPI: Jennifer Melton  is a 85 y.o. female  with moderate persistent asthma, GERD, hypothyroidism, polycythemia vera, due to sudden onset of episode of nausea and vomiting and lightheadedness was admitted to [facility] on 01/03/2018 and was found to have normal LVEF by echocardiogram, asymptomatic NSVT on telemetry, was started on low-dose metoprolol, symptoms felt to be vasovagal and discharged home.  Due to progressively worsening dyspnea and decreased physical activity, mild fatigue, in spite of being 85 years of age she underwent nuclear stress test on 01/20/2018 and was found to have high risk study with mild intensity inferior reversible ischemia. She was started on statin and Imdur at her office visit 2 weeks ago. Advised to use lasix as needed.  Patient does have dementia; therefore, her daughter is providing the majority of the information. She reports that the patient continues to have fatigue and dyspnea on exertion. She has history of asthma that is being treated by Dr. Neldon Mc and reports she has intermittent episodes of shortness of breath with associated wheezing that occur both at night or during the day and can occur with rest.   Daughter states that she cannot tell much difference in her symptoms after using the nebulizer; however, patient does state that she thinks it helps. Denies any chest pain.  This is her 2-3 week appointment. No PND or orthopnea, but occasionally has wheezing at night and unable to tell whether CHF or asthma.  Past Medical History:  Diagnosis Date  . Acute kidney injury ([place])   . Asthma   . Bilateral carotid bruits   . Dementia ([place])   . Dementia ([place])   . Dyspnea on exertion   . Heart murmur   . High blood pressure   . LBBB (left  bundle branch block)   . Left bundle branch block   . Near syncope 01/04/2018  . Polycythemia   . Polycythemia vera ([place])   . Vomiting 01/03/2018    Past Surgical History:  Procedure Laterality Date  . CYST EXCISION     Cyst removal on left breast  . HEMORRHOID SURGERY      Social History   Socioeconomic History  . Marital status: Widowed    Spouse name: Not on file  . Number of children: 4  . Years of education: Not on file  . Highest education level: Not on file  Occupational History  . Not on file  Social Needs  . Financial resource strain: Not on file  . Food insecurity:    Worry: Not on file    Inability: Not on file  . Transportation needs:    Medical: Not on file    Non-medical: Not on file  Tobacco Use  . Smoking status: Passive Smoke Exposure - Never Smoker  . Smokeless tobacco: Never Used  Substance and Sexual Activity  . Alcohol use: Never    Frequency: Never  . Drug use: Never  . Sexual activity: Not on file  Lifestyle  . Physical activity:    Days per week: Not on file    Minutes per session: Not on file  . Stress: Not on file  Relationships  . Social connections:    Talks on phone: Not on file  Gets together: Not on file    Attends religious service: Not on file    Active member of club or organization: Not on file    Attends meetings of clubs or organizations: Not on file    Relationship status: Not on file  . Intimate partner violence:    Fear of current or ex partner: Not on file    Emotionally abused: Not on file    Physically abused: Not on file    Forced sexual activity: Not on file  Other Topics Concern  . Not on file  Social History Narrative  . Not on file    Current Outpatient Medications on File Prior to Visit  Medication Sig Dispense Refill  . albuterol (PROVENTIL HFA) 108 (90 Base) MCG/ACT inhaler Inhale two puffs every four to six hours as needed for cough or wheeze. 1 Inhaler 1  . albuterol (PROVENTIL) (2.5 MG/3ML)  0.083% nebulizer solution Take 2.5 mg by nebulization every 4 (four) hours as needed for wheezing or shortness of breath.    Marland Kitchen aspirin EC 81 MG tablet Take 81 mg by mouth daily.    . famotidine (PEPCID) 40 MG tablet Take 1 tablet (40 mg total) by mouth at bedtime. 30 tablet 5  . fluticasone (FLONASE) 50 MCG/ACT nasal spray Place 1 spray into both nostrils 2 (two) times daily.    . furosemide (LASIX) 40 MG tablet Take 40 mg by mouth.    . hydroxyurea (HYDREA) 500 MG capsule Take 500 mg by mouth daily. May take with food to minimize GI side effects.  1 capsule by mouth two times daily on Monday, Wednesday, and Friday. 1 capsule every other day once daily.    Marland Kitchen ipratropium (ATROVENT) 0.06 % nasal spray Can use two sprays in each nostril every six hours as needed to dry up runny nose. 15 mL 5  . isosorbide mononitrate (IMDUR) 30 MG 24 hr tablet Take 1 tablet (30 mg total) by mouth daily. 30 tablet 2  . losartan (COZAAR) 50 MG tablet Take 50 mg by mouth daily.    . Melatonin 10 MG TBDP Take by mouth as needed.     . metoprolol succinate (TOPROL-XL) 25 MG 24 hr tablet Take 0.5 tablets (12.5 mg total) by mouth daily. 45 tablet 1  . montelukast (SINGULAIR) 10 MG tablet Take 1 tablet (10 mg total) by mouth at bedtime. 90 tablet 1  . Olopatadine HCl 0.6 % SOLN Place 1 spray into both nostrils 2 (two) times daily as needed (allergies).     Marland Kitchen PARoxetine (PAXIL) 40 MG tablet Take 40 mg by mouth daily.    . simvastatin (ZOCOR) 20 MG tablet Take 1 tablet (20 mg total) by mouth at bedtime. 30 tablet 2   Current Facility-Administered Medications on File Prior to Visit  Medication Dose Route Frequency Provider Last Rate Last Dose  . Benralizumab SOSY 30 mg  30 mg Subcutaneous Q28 days Jiles Prows, MD   30 mg at 04/20/18 1607   Review of Systems  Constitutional: Positive for malaise/fatigue. Negative for weight loss.  Respiratory: Positive for cough, shortness of breath and wheezing. Negative for hemoptysis.    Cardiovascular: Negative for chest pain, palpitations, claudication and leg swelling.  Gastrointestinal: Negative for abdominal pain, blood in stool, constipation, heartburn and vomiting.  Genitourinary: Negative for dysuria.  Musculoskeletal: Negative for joint pain and myalgias.  Neurological: Negative for dizziness, focal weakness and headaches.  Endo/Heme/Allergies: Does not bruise/bleed easily.  Psychiatric/Behavioral: Negative for depression. The  patient is not nervous/anxious.   All other systems reviewed and are negative.     Objective:  Blood pressure 124/62, pulse 67, height 5\' 3"  (1.6 m), weight 136 lb (61.7 kg). Body mass index is 24.09 kg/m.  Physical Exam  Constitutional: She appears well-developed. No distress.  Petite  HENT:  Head: Atraumatic.  Eyes: Conjunctivae are normal.  Neck: Neck supple. No JVD present. No thyromegaly present.  Cardiovascular: Normal rate, regular rhythm, normal heart sounds and intact distal pulses. Exam reveals no gallop.  No murmur heard. Pulses:      Carotid pulses are 2+ on the right side with bruit and 2+ on the left side with bruit.      Radial pulses are 2+ on the right side and 2+ on the left side.       Femoral pulses are 2+ on the right side and 2+ on the left side.      Popliteal pulses are 2+ on the right side and 2+ on the left side.       Dorsalis pedis pulses are 2+ on the right side and 2+ on the left side.       Posterior tibial pulses are 2+ on the right side and 2+ on the left side.  Bilateral varicose veins noted.  Pulmonary/Chest: Effort normal and breath sounds normal.  Abdominal: Soft. Bowel sounds are normal.  Musculoskeletal: Normal range of motion.        General: No edema.  Neurological: She is alert.  Skin: Skin is warm and dry.  Psychiatric: She has a normal mood and affect.   CARDIAC STUDIES:   Echo at [facility] 12/31/2017:  Left ventricle: The cavity size was normal. Systolic function was normal. The  estimated ejection fraction was in the range of 55% to 60%. Wall motion was normal; there were no regional wall motion abnormalities. Doppler parameters are consistent with abnormal left ventricular relaxation (grade 1 diastolic dysfunction). - Aortic valve: There was mild regurgitation. - Mitral valve: There was mild regurgitation. - Left atrium: The atrium was mildly dilated. - Right atrium: The atrium was mildly dilated.  Carotid Doppler  Carotid artery duplex 01/23/2018: No hemodynamically significant arterial disease in the right internal carotid artery. No hemodynamically significant arterial disease in the left internal carotid artery. Minimal plaque noted. Antegrade right vertebral artery flow. Antegrade left vertebral artery flow.  Nuclear stress test  Lexiscan myoview stress test 01/20/2018: 1. Lexiscan stress test was performed. Exercise capacity was not assessed. Stress symptoms included headache. Blood pressure was normal. The resting and stress electrocardiogram demonstrated normal sinus rhythm, first degree AV block, LBBB, occasional VPC and nonspecific ST-T changes in inferolateral leads. Stress EKG is non diagnostic for ischemia as it is a pharmacologic stress. 2. The overall quality of the study is good. Left ventricular cavity is enlarged on the rest and stress studies. Gated SPECT images reveal normal myocardial thickening and wall motion. The left ventricular ejection fraction was calculated 33%, although visually appears normal. SPECT images reveal very small sized, mild intensity, mildly reversible perfusion defect in basal inferior/inferoseptal myocardium. 3. High risk study due to reduced LVEF. Clinical correlation recommended.  Assessment & Recommendations:   Dyspnea on exertion  Abnormal nuclear stress test  Left bundle branch block  EKG 01/02/2018: Sinus rhythm with first-degree AV block at rate of 68 bpm, left atrial enlargement, left bundle branch block.   No further analysis.   Recommendation: Patient is here on a 2 week follow-up visit of abnormal nuclear  stress test, dyspnea on exertion, and fatigue.  Medical management has been recommended in view of her advanced age and high risk for cardioembolic phenomena unless persistent symptoms.   She continues to have symptoms despite starting medical therapy. I have again discussed stress test findings with the patient and her daughter along with risk of performing heart catheterization. I would prefer to avoid catheterization unless symptoms are significant and her quality of life is affected as she only had small, mild intensity perfusion abnormality on stress test, I am unsure that all of her symptoms are angina equivalent.   I feel that it would be worthwhile to obtain evaluation from pulmonary for her shortness of breath and wheezing as possible etiology before performing possibly performing catheterization. The visit with pulmonologist has been postponed due to Covid 9 issues.  She does have a virtual visit with her allergist [facility] in [place] and advised him to discuss whether her symptoms are more consistent with asthma than with CAD.  As symptoms are still class 2-3, we'll continue watchful observation and if there is progression of symptoms, she'll need cardiac catheterization.   She will continue with current medical therapy. Blood pressure and heart rate are stable. Has chronic left bundle branch block that was noted on previous EKG. I will see her back in 3 months for follow up on her symptoms, but encouraged her to contact me sooner if needed.  Adrian Prows, MD, [facility] 05/13/2018, 9:01 AM [facility] [place] Pager: [phone] Office: [phone] If no answer Cell [phone]

## 2018-05-10 ENCOUNTER — Ambulatory Visit (INDEPENDENT_AMBULATORY_CARE_PROVIDER_SITE_OTHER): Payer: MEDICARE | Admitting: Allergy and Immunology

## 2018-05-10 ENCOUNTER — Other Ambulatory Visit: Payer: Self-pay

## 2018-05-10 ENCOUNTER — Telehealth: Payer: Self-pay

## 2018-05-10 ENCOUNTER — Encounter: Payer: Self-pay | Admitting: Allergy and Immunology

## 2018-05-10 DIAGNOSIS — J3089 Other allergic rhinitis: Secondary | ICD-10-CM | POA: Diagnosis not present

## 2018-05-10 DIAGNOSIS — I509 Heart failure, unspecified: Secondary | ICD-10-CM

## 2018-05-10 DIAGNOSIS — J4550 Severe persistent asthma, uncomplicated: Secondary | ICD-10-CM | POA: Diagnosis not present

## 2018-05-10 DIAGNOSIS — K219 Gastro-esophageal reflux disease without esophagitis: Secondary | ICD-10-CM | POA: Diagnosis not present

## 2018-05-10 MED ORDER — PREDNISONE 10 MG PO TABS: ORAL_TABLET | ORAL | 0 refills | Status: DC

## 2018-05-10 NOTE — Progress Notes (Signed)
[place] [facility] [place] [facility] [place]   Follow-up Note  Referring Provider: Algis Greenhouse, MD Primary Provider: Algis Greenhouse, MD Date of Office Visit: 05/10/2018  Subjective:   Jennifer Melton (DOB: February 14, 1927) is a 85 y.o. female who returns to the [facility] on 05/10/2018 in re-evaluation of the following:  HPI: This is a E - Med visit requested by patient's daughter who is located at home.  Amori is followed in this clinic for recurrent respiratory tract symptoms presently called asthma and allergic rhinitis and reflux induced respiratory disease as well as a history of diastolic dysfunction and possible coronary artery disease followed by Dr. Einar Gip and a history of polycythemia vera followed by Dr. Bobby Rumpf.  Her last visit in this clinic was 30 March 2018.  Recently with intermittent coughing, shortness of breath, and post tussive emesis over past week. Still using all meds with nebulizer and using benralizumab. Occasionally uses albuterol averaging out to 1-2 times per day which helps. No fever, ugly nasal discharge, ugly sputum, chest pain. Daughter believes the trigger is pollen exposure.  Has not seen a pulmonologist yet.  Reflux was OK until gagging with coughing.  Occasionally developed some choking episodes especially with coughing.  Rarely has any difficulty swallowing food.  Removed aricept, still with same level of low energy and fatigue. Must less nasal drainage since discontinuing aricept  Next visit with Dr. Bobby Rumpf end of this month.  Has had hydroxyurea decreased 1/3 the past month.  Performing self isolation secondary to coronavirus pandemic.  Allergies as of 05/10/2018      Reactions   Ciprofloxacin Other (See Comments)   unknown   Penicillins Other (See Comments)   unknown   Pseudoephedrine Other (See Comments)   unknown      Medication List      albuterol (2.5 MG/3ML) 0.083% nebulizer solution Commonly known as:   PROVENTIL Take 2.5 mg by nebulization every 4 (four) hours as needed for wheezing or shortness of breath.   albuterol 108 (90 Base) MCG/ACT inhaler Commonly known as:  Proventil HFA Inhale two puffs every four to six hours as needed for cough or wheeze.   aspirin EC 81 MG tablet Take 81 mg by mouth daily.   budesonide 0.5 MG/2ML nebulizer solution Commonly known as:  PULMICORT Take 0.5 mg by nebulization 2 (two) times daily.   famotidine 40 MG tablet Commonly known as:  PEPCID Take 1 tablet (40 mg total) by mouth at bedtime.   fluticasone 50 MCG/ACT nasal spray Commonly known as:  FLONASE Place 1 spray into both nostrils 2 (two) times daily.   furosemide 40 MG tablet Commonly known as:  LASIX Take 40 mg by mouth.   hydroxyurea 500 MG capsule Commonly known as:  HYDREA Take 500 mg by mouth daily. May take with food to minimize GI side effects.  1 capsule by mouth two times daily on Monday, Wednesday, and Friday. 1 capsule every other day once daily.   ipratropium 0.02 % nebulizer solution Commonly known as:  ATROVENT Take 0.5 mg by nebulization every 4 (four) hours as needed for wheezing or shortness of breath.   ipratropium 0.06 % nasal spray Commonly known as:  ATROVENT Can use two sprays in each nostril every six hours as needed to dry up runny nose.   isosorbide mononitrate 30 MG 24 hr tablet Commonly known as:  IMDUR Take 1 tablet (30 mg total) by mouth daily.   losartan  50 MG tablet Commonly known as:  COZAAR Take 50 mg by mouth daily.   Melatonin 10 MG Tbdp Take by mouth as needed.   metoprolol succinate 25 MG 24 hr tablet Commonly known as:  TOPROL-XL Take 0.5 tablets (12.5 mg total) by mouth daily.   montelukast 10 MG tablet Commonly known as:  SINGULAIR Take 1 tablet (10 mg total) by mouth at bedtime.   Olopatadine HCl 0.6 % Soln Place 1 spray into both nostrils 2 (two) times daily as needed (allergies).   PARoxetine 40 MG tablet Commonly known  as:  PAXIL Take 40 mg by mouth daily.   Perforomist 20 MCG/2ML nebulizer solution Generic drug:  formoterol Take 20 mcg by nebulization 2 (two) times daily.   simvastatin 20 MG tablet Commonly known as:  ZOCOR Take 1 tablet (20 mg total) by mouth at bedtime.       Past Medical History:  Diagnosis Date  . Acute kidney injury ([place])   . Asthma   . Bilateral carotid bruits   . Dementia ([place])   . Dementia ([place])   . Dyspnea on exertion   . Heart murmur   . High blood pressure   . LBBB (left bundle branch block)   . Left bundle branch block   . Near syncope 01/04/2018  . Polycythemia   . Polycythemia vera ([place])   . Vomiting 01/03/2018    Past Surgical History:  Procedure Laterality Date  . CYST EXCISION     Cyst removal on left breast  . HEMORRHOID SURGERY      Review of systems negative except as noted in HPI / PMHx or noted below:  Review of Systems  Constitutional: Negative.   HENT: Negative.   Eyes: Negative.   Respiratory: Negative.   Cardiovascular: Negative.   Gastrointestinal: Negative.   Genitourinary: Negative.   Musculoskeletal: Negative.   Skin: Negative.   Neurological: Negative.   Endo/Heme/Allergies: Negative.   Psychiatric/Behavioral: Negative.      Objective:   There were no vitals filed for this visit.       Physical Exam-deferred  Diagnostics: None  Assessment and Plan:   1. Not well controlled severe persistent asthma   2. Perennial allergic rhinitis   3. LPRD (laryngopharyngeal reflux disease)   4. Chronic congestive heart failure, unspecified heart failure type ([place])     1. Continue to treat inflammation:   A.  Budesonide 0.5 mg nebulized twice a day  B.  Perforomist nebulized twice a day  C.  Fluticasone - 1 spray each nostril 1 times per day  D.  Montelukast 10mg  - 1 tablet one time per day  E.  Benralizumab injection   2. Continue to Treat reflux:   A. Omeprazole 40mg  tablet twice a day   B. Famotidine 40mg  tablet  in PM  3. If Needed:   A. Proventil HFA - 2 inhalations every 4-6 hours  B. Duoneb nebulization every 4-6 hours   C. Patanase - 2 sprays each nostril 2 times per day    4.  For this recent episode Prednisone 10mg  - 2 tabs x 5 days, then 1 tab x 5 days  5.  Obtain a modified barium swallow for aspiration.   6. Return in 8 weeks or earlier if problem.    7. May need to see pulmonology if MBS normal.  Ashe still continues to have problems with intermittent coughing and shortness of breath and posttussive emesis even in the face of utilizing anti-inflammatory agents for  her airway on a regular basis and addressing the issue of reflux induced respiratory disease.  She has had evaluation for her respiratory issues when living in [place] and we have been unable to obtain those records to review what type of evaluation has been performed to date.  I think we need to press ahead to see if we are having a problem with microaspiration and we will obtain a modified barium swallow to look into this issue.  There may be an exacerbation of her lung disease secondary to pollen exposure and I will give her a relatively low dose of systemic steroids as noted above which has helped her in the past regarding this issue.  She will continue to utilize all the therapy noted above directed against respiratory tract inflammation and reflux and I will contact her daughter once I have the results of her modified barium swallow available for review.  If her modified barium swallow identifies no issues and she still continues to cough will refer her on to pulmonology.  Total patient interaction time 21 minutes.  Allena Katz, MD Allergy / Immunology [place]

## 2018-05-10 NOTE — Telephone Encounter (Signed)
[facility] to schedule Modified Barium Swallow and had to leave a message. Please schedule a MBS with diagnosis T17.908A Aspiration into airway. NOTE: No PA required per insurance.

## 2018-05-10 NOTE — Telephone Encounter (Signed)
[facility] called back and stated that insurance would not except Aspiration as diagnosis so, per Dr. Neldon Mc ok to change to dysphagia. Re-faxed updated form to [facility].

## 2018-05-10 NOTE — Patient Instructions (Addendum)
  1. Continue to treat inflammation:   A.  Budesonide 0.5 mg nebulized twice a day  B.  Perforomist nebulized twice a day  C.  Fluticasone - 1 spray each nostril 1 times per day  D.  Montelukast 10mg  - 1 tablet one time per day  E.  Benralizumab injection   2. Continue to Treat reflux:   A. Omeprazole 40mg  tablet twice a day   B. Famotidine 40mg  tablet in PM  3. If Needed:   A. Proventil HFA - 2 inhalations every 4-6 hours  B. Duoneb nebulization every 4-6 hours   C. Patanase - 2 sprays each nostril 2 times per day    4.  For this recent episode Prednisone 10mg  - 2 tabs x 5 days, then 1 tab x 5 days  5.  Obtain a modified barium swallow for aspiration.   6. Return in 8 weeks or earlier if problem.    7. May need to see pulmonology if MBS normal.

## 2018-05-10 NOTE — Telephone Encounter (Signed)
[facility] called back and requested that we fax order form and demographic information to them and they will contact patient. Will fax info as requested to [phone].

## 2018-05-11 ENCOUNTER — Encounter: Payer: Self-pay | Admitting: Allergy and Immunology

## 2018-05-13 ENCOUNTER — Encounter: Payer: Self-pay | Admitting: Cardiology

## 2018-05-15 ENCOUNTER — Telehealth: Payer: Self-pay

## 2018-05-15 NOTE — Telephone Encounter (Signed)
Please inform daughter that we will attempt to get her to be seen by pulmonology at [facility] as soon as possible.  Let us get the results of her modified barium swallow today.

## 2018-05-15 NOTE — Telephone Encounter (Signed)
Patient's daughter called to inform you that Ms. Sohm cough is still occurring. She states that she believes the cough has worsened and gotten "deeper." She also stated that Ms. Underhill goes at 12:15 today to get her swallow test done.

## 2018-05-16 NOTE — Telephone Encounter (Signed)
Referral requests sent to [facility] for pulm and GI per Dr. Bruna Potter request.

## 2018-05-17 ENCOUNTER — Telehealth: Payer: Self-pay

## 2018-05-17 ENCOUNTER — Encounter: Payer: Self-pay | Admitting: *Deleted

## 2018-05-17 NOTE — Telephone Encounter (Signed)
It looks like the cardiologist has sent a referral for Pulmonary also. I put a new one in as urgent. I know Pulmonary is being very strict on their visits and a lot of their docs are working at the hospital. I will call and see if they will do a visit.   I called Dr Kathe Mariner office and they would like me to fax over the notes and results and they will decide if they can bring her in sooner due to their [place] rules. Everything has been sent  Thanks

## 2018-05-17 NOTE — Telephone Encounter (Signed)
Thank you, Dee.

## 2018-05-17 NOTE — Telephone Encounter (Signed)
-----   Message from Glendell Docker, [place] sent at 05/16/2018  9:01 AM EDT ----- Regarding: STAT Referral Dr. Neldon Mc wants this patient to see pulmonology and GI as soon as possible.   Pulmonary: Anyone at [facility] in [place] (Chronic cough)  GI: Dr. Lyda Jester at [facility] in [place] (Protrusion in upper esophagus found in Modified Barium Swallow Study)

## 2018-05-18 ENCOUNTER — Ambulatory Visit: Payer: Medicare Other

## 2018-05-18 ENCOUNTER — Other Ambulatory Visit: Payer: Self-pay

## 2018-05-22 ENCOUNTER — Ambulatory Visit (INDEPENDENT_AMBULATORY_CARE_PROVIDER_SITE_OTHER): Payer: MEDICARE | Admitting: Internal Medicine

## 2018-05-22 ENCOUNTER — Telehealth: Payer: Self-pay | Admitting: Allergy and Immunology

## 2018-05-22 ENCOUNTER — Encounter: Payer: Self-pay | Admitting: Internal Medicine

## 2018-05-22 ENCOUNTER — Other Ambulatory Visit: Payer: Self-pay

## 2018-05-22 VITALS — BP 110/60 | HR 60 | Temp 97.8°F | Ht 60.0 in | Wt 144.6 lb

## 2018-05-22 DIAGNOSIS — J45991 Cough variant asthma: Secondary | ICD-10-CM | POA: Insufficient documentation

## 2018-05-22 MED ORDER — GABAPENTIN 100 MG PO CAPS: ORAL_CAPSULE | ORAL | 2 refills | Status: DC

## 2018-05-22 MED ORDER — PREDNISONE 10 MG PO TABS: ORAL_TABLET | ORAL | 2 refills | Status: DC

## 2018-05-22 MED ORDER — FLUTTER DEVI: 0 refills | Status: AC

## 2018-05-22 NOTE — Progress Notes (Addendum)
Jennifer Melton, female    DOB: 30-Apr-1927,     MRN: [id number]   Brief patient profile:  72 yowf Never smoker from [place] moved go [place] fall 2019 to live with daughter ? Childhood illness other whooping cough but no chronic resp problems with new onset x 2016 cough to point of vomiting daily year round so eval in [facility] first with allergies to trees and placed on shots no better and  So stopped 12/28/17 and rx Benralizumab 02/23/2018 by Dr Neldon Mc but remains "steroid dep" to control coughing so referred to pulmonary clinic 05/22/2018 by Dr   Neldon Mc      History of Present Illness  05/22/2018  Pulmonary/ 1st office eval/Mikyla Schachter / last prednisone 2 days prior to OV  / on singulair/ laba/ics neb and prn duoneb and "lots of cough drops" Chief Complaint  Patient presents with  . Pulmonary Consult    Referred [name], NP. Pt c/o cough x 4 years- sometimes coughs until the point of vomiting. She is using her albuterol inhaler 3 x per day on average.   Dyspnea:  Doe = MMRC3 = can't walk 100 yards even at a slow pace at a flat grade s stopping due to sob  Cough: "tickle" starting to come again plast dose  prednisone a few days prior to OV   Sleep: on bed blocks/ cough some better noct than daytime but daughter notes some noct "wheze" SABA use: as above/ seems to helps though note baseline hfa very poor technique and now on maint laba/ics neb    No obvious day to day or daytime variability or assoc excess/ purulent sputum or mucus plugs or hemoptysis or cp or chest tightness   or overt sinus or hb symptoms o ppi bid and h2 hs     Also denies any obvious fluctuation of symptoms with weather or environmental changes or other aggravating or alleviating factors except as outlined above   No unusual exposure hx or h/o childhood pna/ asthma or knowledge of premature birth.  Current Allergies, Complete Past Medical History, Past Surgical History, Family History, and Social History were reviewed in  [employer] record.  ROS  The following are not active complaints unless bolded Hoarseness, sore throat, dysphagia, dental problems, itching, sneezing,  nasal congestion or discharge of excess mucus or purulent secretions, ear ache,   fever, chills, sweats, unintended wt loss or wt gain, classically pleuritic or exertional cp,  orthopnea pnd or arm/hand swelling  or leg swelling, presyncope, palpitations, abdominal pain, anorexia, nausea, vomiting, diarrhea  or change in bowel habits or change in bladder habits, change in stools or change in urine, dysuria, hematuria,  rash, arthralgias, visual complaints, headache, numbness, weakness or ataxia or problems with walking or coordination,  change in mood or  memory.             Past Medical History:  Diagnosis Date  . Acute kidney injury ([place])   . Asthma   . Bilateral carotid bruits   . Dementia ([place])   . Dementia ([place])   . Dyspnea on exertion   . Heart murmur   . High blood pressure   . LBBB (left bundle branch block)   . Left bundle branch block   . Near syncope 01/04/2018  . Polycythemia   . Polycythemia vera ([place])   . Vomiting 01/03/2018    Outpatient Medications Prior to Visit  Medication Sig Dispense Refill  . albuterol (PROVENTIL HFA) 108 (90 Base)  MCG/ACT inhaler Inhale two puffs every four to six hours as needed for cough or wheeze. 1 Inhaler 1  . albuterol (PROVENTIL) (2.5 MG/3ML) 0.083% nebulizer solution Take 2.5 mg by nebulization every 4 (four) hours as needed for wheezing or shortness of breath.    Marland Kitchen aspirin EC 81 MG tablet Take 81 mg by mouth daily.    . budesonide (PULMICORT) 0.5 MG/2ML nebulizer solution Take 0.5 mg by nebulization 2 (two) times daily.    . famotidine (PEPCID) 40 MG tablet Take 1 tablet (40 mg total) by mouth at bedtime. 30 tablet 5  . fluticasone (FLONASE) 50 MCG/ACT nasal spray Place 1 spray into both nostrils 2 (two) times daily.    . formoterol (PERFOROMIST) 20 MCG/2ML  nebulizer solution Take 20 mcg by nebulization 2 (two) times daily.    . furosemide (LASIX) 40 MG tablet Take 40 mg by mouth.    . hydroxyurea (HYDREA) 500 MG capsule Take 500 mg by mouth daily. May take with food to minimize GI side effects.  1 capsule by mouth two times daily on Monday, Wednesday, and Friday. 1 capsule every other day once daily.    Marland Kitchen ipratropium (ATROVENT) 0.02 % nebulizer solution Take 0.5 mg by nebulization every 4 (four) hours as needed for wheezing or shortness of breath.    Marland Kitchen ipratropium (ATROVENT) 0.06 % nasal spray Can use two sprays in each nostril every six hours as needed to dry up runny nose. 15 mL 5  . isosorbide mononitrate (IMDUR) 30 MG 24 hr tablet Take 1 tablet (30 mg total) by mouth daily. 30 tablet 2  . losartan (COZAAR) 50 MG tablet Take 50 mg by mouth daily.    . Melatonin 10 MG TBDP Take by mouth as needed.     . metoprolol succinate (TOPROL-XL) 25 MG 24 hr tablet Take 0.5 tablets (12.5 mg total) by mouth daily. 45 tablet 1  . montelukast (SINGULAIR) 10 MG tablet Take 1 tablet (10 mg total) by mouth at bedtime. 90 tablet 1  . Olopatadine HCl 0.6 % SOLN Place 1 spray into both nostrils 2 (two) times daily as needed (allergies).     Marland Kitchen PARoxetine (PAXIL) 40 MG tablet Take 40 mg by mouth daily.    . simvastatin (ZOCOR) 20 MG tablet Take 1 tablet (20 mg total) by mouth at bedtime. 30 tablet 2  . predniSONE (DELTASONE) 10 MG tablet Take two tablets daily for five days; then take one tablet daily for five days. 15 tablet 0   Facility-Administered Medications Prior to Visit  Medication Dose Route Frequency Provider Last Rate Last Dose  . Benralizumab SOSY 30 mg  30 mg Subcutaneous Q28 days Kozlow, Donnamarie Poag, MD   30 mg at 04/20/18 1607     Objective:     BP 110/60 (BP Location: Left Arm, Cuff Size: Normal)   Pulse 60   Temp 97.8 F (36.6 C) (Oral)   Ht 5' (1.524 m)   Wt 144 lb 9.6 oz (65.6 kg)   SpO2 99%   BMI 28.24 kg/m   SpO2: 99 % RA  Stoic amb  wf extremely hard of hearing and also not sure she processes questions correctly so daughter does all the talking / pt has minimal rattling on voluntary cough maneuver   HEENT: nl dentition, turbinates bilaterally, and oropharynx. Nl external ear canals without cough reflex   NECK :  without JVD/Nodes/TM/ nl carotid upstrokes bilaterally   LUNGS: no acc muscle use,  Nl contour chest  which is clear to A and P bilaterally without cough on insp or exp maneuvers   CV:  RRR  no s3 or murmur or increase in P2, and no edema   ABD:  soft and nontender with nl inspiratory excursion in the supine position. No bruits or organomegaly appreciated, bowel sounds nl  MS:  Nl gait/ ext warm without deformities, calf tenderness, cyanosis or clubbing No obvious joint restrictions   SKIN: warm and dry without lesions    NEURO:  alert,   with  no motor or cerebellar deficits apparent - unclear re sensorium as can't understand questions ? All due to hearing (carries dx of dementia)     I personally reviewed images and agree with radiology impression as follows:  CXR:   01/03/18 No active cardiopulmonary disease.      Assessment   Cough variant asthma Onset around 2016/? steroid dep  - Benralizumab started 02/23/2018 - MBS 04/2018 req from [facility]  - Gabapentin trial 100 mg bid 05/22/2018 >>>    DDX of  difficult airways management almost all start with A and  include Adherence, Ace Inhibitors, Acid Reflux, Active Sinus Disease, Alpha 1 Antitripsin deficiency, Anxiety masquerading as Airways dz,  ABPA,  Allergy(esp in young), Aspiration (esp in elderly), Adverse effects of meds,  Active smoking or vaping, A bunch of PE's (a small clot burden can't cause this syndrome unless there is already severe underlying pulm or vascular dz with poor reserve) plus two Bs  = Bronchiectasis and Beta blocker use..and one C= CHF   Adherence is always the initial "prime suspect" and is a multilayered concern that  requires a "trust but verify" approach in every patient - starting with knowing how to use medications, especially inhalers, correctly, keeping up with refills and understanding the fundamental difference between maintenance and prns vs those medications only taken for a very short course and then stopped and not refilled.  - - The proper method of use, as well as anticipated side effects, of a metered-dose inhaler are discussed and demonstrated to the patient. Improved effectiveness after extensive coaching during this visit to a level of approximately 0 % from a baseline of 0 %   -looks like her attentive daughter can help her with this and agree she can't use hfa  - return with all meds in hand using a trust but verify approach to confirm accurate Medication  Reconciliation The principal here is that until we are certain that the  patients are doing what we've asked, it makes no sense to ask them to do more.   ? Acid (or non-acid) GERD > always difficult to exclude as up to 75% of pts in some series report no assoc GI/ Heartburn symptoms> rec continue  max (24h)  acid suppression and diet restrictions/ reviewed and instructions given in writing.   NO COUGH DROPS - Of the three most common causes of  Sub-acute / recurrent or chronic cough, only one (GERD)  can actually contribute to/ trigger  the other two (asthma and post nasal drip syndrome)  and perpetuate the cylce of cough.  While not intuitively obvious, many patients with chronic low grade reflux do not cough until there is a primary insult that disturbs the protective epithelial barrier and exposes sensitive nerve endings.   This is typically viral but can due to PNDS and  either may apply here.    >>> The point is that once this occurs, it is difficult to eliminate the cycle  using anything but a maximally effective acid suppression regimen at least in the short run, accompanied by an appropriate diet to address non acid GERD and added flutter  valve to help with traumatic cough/gag  to point of vomiting control / eliminate the "tickle" itself with gabapentin titrated to as high as 300 mg tid if needed/ tolerated (daughter is already on it and familiar with the drug)   ? Allergy  > continue fosrena per Dr Bruna Potter office though low threshold to d/c p 6 m if seeing no improvement and continue lama/laba with prns saba (thoughwould avoid atrovent for now)  - The goal with a chronic steroid dependent illness is always arriving at the lowest effective dose that controls the disease/symptoms and not accepting a set "formula" which is based on statistics or guidelines that don't always take into account patient  variability or the natural hx of the dz in every individual patient, which may well vary over time.  For now therefore I recommend the patient maintain  20 mg ceiling and floor of 0 to control this until other measures have had a chance to work  ? Active sinus dz > don't see a sinus CT or ent eval on record, defer to Dr Neldon Mc   ? Betablocker effects >  Low doses of toprol probably ok but if need higher: In the setting of respiratory symptoms of unknown etiology,  It would be preferable to use bystolic, the most beta -1  selective Beta blocker available in sample form, with bisoprolol the most selective generic choice  on the market, at least on a trial basis, to make sure the spillover Beta 2 effects of the less specific Beta blockers are not contributing to this patient's symptoms.   ? chf / cardiac asthma  > bnp nl during flare makes this less likely but she could still have ischemia related transient chf or exertional angina expressed as doe > f/u Gangi    >>>>Instructed on flutter valve to prevent cyclical coughing    >>> Would like to see her back in 4 weeks in office to complete the w/u.      Total time devoted to counseling  > 50 % of initial 60 min office visit:  review case with pt/daughter  device teaching which extended  face to face time for this visit / discussion of options/alternatives/ personally creating written customized instructions  in presence of pt  then going over those specific  Instructions directly with the pt including how to use all of the meds but in particular covering each new medication in detail and the difference between the maintenance= "automatic" meds and the prns using an action plan format for the latter (If this problem/symptom => do that organization reading Left to right).  Please see AVS from this visit for a full list of these instructions which I personally wrote for this pt and  are unique to this visit.        Christinia Gully, MD 05/22/2018

## 2018-05-22 NOTE — Telephone Encounter (Signed)
Faxed order to [facility] for nebulizer

## 2018-05-22 NOTE — Telephone Encounter (Signed)
Please inform patient that we will get her a nebulizer from [facility].  Please acquire this nebulizer for [facility].

## 2018-05-22 NOTE — Telephone Encounter (Signed)
Loula's daughter called in and wanted to know if Dr. Neldon Mc would put in an order for Jennifer Melton to get a new nebulizer.  She states the one Tamicka uses is the one she obtained in [place].  She states Arnetha can get one through [facility].  Please advise.

## 2018-05-22 NOTE — Telephone Encounter (Signed)
Left message for patient's daughter to call office.

## 2018-05-22 NOTE — Patient Instructions (Addendum)
Prednisone 10 mg take 2 daily until better, then 1 daily x 5 days and one half daily x 5 days and stop   Gabapentin 100 mg twice daily for now  Continue performist and budesonide twice daily and stop ipatropium    Continue fosfenra per Dr Neldon Mc  For cough > mucinex dm up to 1200 mg twice daily and flutter valve   GERD (REFLUX)  is an extremely common cause of respiratory symptoms just like yours , many times with no obvious heartburn at all.    It can be treated with medication, but also with lifestyle changes including elevation of the head of your bed (ideally with 6 -8inch blocks under the headboard of your bed),  Smoking cessation, avoidance of late meals, excessive alcohol, and avoid fatty foods, chocolate, peppermint, colas, red wine, and acidic juices such as orange juice.  NO MINT OR MENTHOL PRODUCTS SO NO COUGH DROPS  USE SUGARLESS CANDY INSTEAD (Jolley ranchers or Stover's or Life Savers) or even ice chips will also do - the key is to swallow to prevent all throat clearing. NO OIL BASED VITAMINS - use powdered substitutes.  Avoid fish oil when coughing.   Please schedule a follow up office visit in 4 weeks, sooner if needed

## 2018-05-22 NOTE — Telephone Encounter (Signed)
Informed patient's daughter will send order to [facility].

## 2018-05-23 ENCOUNTER — Telehealth: Payer: Self-pay | Admitting: Internal Medicine

## 2018-05-23 ENCOUNTER — Encounter: Payer: Self-pay | Admitting: Internal Medicine

## 2018-05-23 DIAGNOSIS — J4550 Severe persistent asthma, uncomplicated: Secondary | ICD-10-CM | POA: Diagnosis not present

## 2018-05-23 NOTE — Progress Notes (Addendum)
Patient is at home. Provider is at the office.  Start Time: 11:49 am End Time: 12:16 pm Verbal consent given to bill insurance

## 2018-05-23 NOTE — Telephone Encounter (Signed)
[place] and left detailed VM for Prednisone instructions from Dr Melvyn Novas, with call back number. [facility] pharmacy is not open until 10am.  Per Dr Melvyn Novas 05/22/18 OV-     Prednisone 10 mg take 2 daily until better, then 1 daily x 5 days and one half daily x 5 days and stop

## 2018-05-23 NOTE — Assessment & Plan Note (Addendum)
Onset around 2016/? steroid dep  - Benralizumab started 02/23/2018 - MBS 04/2018 req from [facility]  - Gabapentin trial 100 mg bid 05/22/2018 >>>    DDX of  difficult airways management almost all start with A and  include Adherence, Ace Inhibitors, Acid Reflux, Active Sinus Disease, Alpha 1 Antitripsin deficiency, Anxiety masquerading as Airways dz,  ABPA,  Allergy(esp in young), Aspiration (esp in elderly), Adverse effects of meds,  Active smoking or vaping, A bunch of PE's (a small clot burden can't cause this syndrome unless there is already severe underlying pulm or vascular dz with poor reserve) plus two Bs  = Bronchiectasis and Beta blocker use..and one C= CHF   Adherence is always the initial "prime suspect" and is a multilayered concern that requires a "trust but verify" approach in every patient - starting with knowing how to use medications, especially inhalers, correctly, keeping up with refills and understanding the fundamental difference between maintenance and prns vs those medications only taken for a very short course and then stopped and not refilled.  - - The proper method of use, as well as anticipated side effects, of a metered-dose inhaler are discussed and demonstrated to the patient. Improved effectiveness after extensive coaching during this visit to a level of approximately 0 % from a baseline of 0 %   -looks like her attentive daughter can help her with this and agree she can't use hfa  - return with all meds in hand using a trust but verify approach to confirm accurate Medication  Reconciliation The principal here is that until we are certain that the  patients are doing what we've asked, it makes no sense to ask them to do more.   ? Acid (or non-acid) GERD > always difficult to exclude as up to 75% of pts in some series report no assoc GI/ Heartburn symptoms> rec continue  max (24h)  acid suppression and diet restrictions/ reviewed and instructions given in writing.   NO COUGH  DROPS - Of the three most common causes of  Sub-acute / recurrent or chronic cough, only one (GERD)  can actually contribute to/ trigger  the other two (asthma and post nasal drip syndrome)  and perpetuate the cylce of cough.  While not intuitively obvious, many patients with chronic low grade reflux do not cough until there is a primary insult that disturbs the protective epithelial barrier and exposes sensitive nerve endings.   This is typically viral but can due to PNDS and  either may apply here.    >>> The point is that once this occurs, it is difficult to eliminate the cycle  using anything but a maximally effective acid suppression regimen at least in the short run, accompanied by an appropriate diet to address non acid GERD and added flutter valve to help with traumatic cough/gag  to point of vomiting control / eliminate the "tickle" itself with gabapentin titrated to as high as 300 mg tid if needed/ tolerated (daughter is already on it and familiar with the drug)   ? Allergy  > continue fosrena per Dr Bruna Potter office though low threshold to d/c p 6 m if seeing no improvement and continue lama/laba with prns saba (thoughwould avoid atrovent for now)  - The goal with a chronic steroid dependent illness is always arriving at the lowest effective dose that controls the disease/symptoms and not accepting a set "formula" which is based on statistics or guidelines that don't always take into account patient  variability or the  natural hx of the dz in every individual patient, which may well vary over time.  For now therefore I recommend the patient maintain  20 mg ceiling and floor of 0 to control this until other measures have had a chance to work  ? Active sinus dz > don't see a sinus CT or ent eval on record, defer to Dr Neldon Mc   ? Betablocker effects >  Low doses of toprol probably ok but if need higher: In the setting of respiratory symptoms of unknown etiology,  It would be preferable to use  bystolic, the most beta -1  selective Beta blocker available in sample form, with bisoprolol the most selective generic choice  on the market, at least on a trial basis, to make sure the spillover Beta 2 effects of the less specific Beta blockers are not contributing to this patient's symptoms.   ? chf / cardiac asthma  > bnp nl during flare makes this less likely but she could still have ischemia related transient chf or exertional angina expressed as doe > f/u Gangi    Instructed on flutter valve to prevent cyclical coughing    Would like to see her back in 4 weeks in office to complete the w/u.     Total time devoted to counseling  > 50 % of initial 60 min office visit:  review case with pt/daughter  device teaching which extended face to face time for this visit / discussion of options/alternatives/ personally creating written customized instructions  in presence of pt  then going over those specific  Instructions directly with the pt including how to use all of the meds but in particular covering each new medication in detail and the difference between the maintenance= "automatic" meds and the prns using an action plan format for the latter (If this problem/symptom => do that organization reading Left to right).  Please see AVS from this visit for a full list of these instructions which I personally wrote for this pt and  are unique to this visit.

## 2018-05-24 ENCOUNTER — Ambulatory Visit (INDEPENDENT_AMBULATORY_CARE_PROVIDER_SITE_OTHER): Payer: MEDICARE | Admitting: *Deleted

## 2018-05-24 DIAGNOSIS — J4550 Severe persistent asthma, uncomplicated: Secondary | ICD-10-CM | POA: Diagnosis not present

## 2018-05-25 ENCOUNTER — Ambulatory Visit: Payer: MEDICARE | Admitting: Allergy and Immunology

## 2018-05-25 ENCOUNTER — Encounter: Payer: Self-pay | Admitting: Cardiology

## 2018-05-29 ENCOUNTER — Telehealth: Payer: Self-pay | Admitting: Internal Medicine

## 2018-05-29 NOTE — Telephone Encounter (Signed)
Called and spoke with pt's daughter regarding RX for prednisone at [facility] She advised that she just rec'd call that it was ready for pick up will on phone with our office The [facility]one [facility] today by MW Pt's daughter on DPR verbalized understanding,  Nothing further needed.

## 2018-05-29 NOTE — Telephone Encounter (Signed)
Looks clear to me - what part do they not understand  (the #60 prescribed means she could stay on as many as 2 daily until seen)

## 2018-05-29 NOTE — Telephone Encounter (Signed)
[facility] calling for clarification of rx? Prednisone 10 mg Sent 4/27  Directions: Take 2 each am until better then 1 daily x 5 days and one half daily x 5 days and stop

## 2018-05-31 ENCOUNTER — Telehealth: Payer: Self-pay

## 2018-05-31 ENCOUNTER — Telehealth: Payer: Self-pay | Admitting: Internal Medicine

## 2018-05-31 NOTE — Telephone Encounter (Signed)
Pt's daughter Leda Gauze called wanting to let you know that pt is having ankle swelling and was seen at pcp today. Sinus xray also done but was normal. Kidney function was low-30. Referred to pulm-Dr. Melvyn Novas. She just wanted to update you on what was going on.  Her main concern was swelling and if it could be related to her heart. Please review and advise if needed.//ah

## 2018-05-31 NOTE — Telephone Encounter (Signed)
It appears to be appropriate for pulmonary evaluation and renal referral. I will be happy to see if needed

## 2018-05-31 NOTE — Telephone Encounter (Signed)
The directions are clear  As stated in previous msg regarding this  I spoke with Dorothea Ogle, pharmacist and told him again to please dispense the medication with directions as stated on rx   Sig: Take 2 each am until better then 1 daily x 5 days and one half daily x 5 days and stop  He verbalized understanding

## 2018-06-01 NOTE — Telephone Encounter (Signed)
LMOM with daughter Leda Gauze advising.//ah

## 2018-06-04 ENCOUNTER — Other Ambulatory Visit: Payer: Self-pay | Admitting: Cardiology

## 2018-06-05 NOTE — Telephone Encounter (Signed)
Please fill

## 2018-06-09 ENCOUNTER — Other Ambulatory Visit: Payer: Self-pay | Admitting: Cardiology

## 2018-06-21 ENCOUNTER — Telehealth: Payer: Self-pay | Admitting: Internal Medicine

## 2018-06-21 ENCOUNTER — Other Ambulatory Visit: Payer: Self-pay

## 2018-06-21 ENCOUNTER — Ambulatory Visit (INDEPENDENT_AMBULATORY_CARE_PROVIDER_SITE_OTHER): Payer: MEDICARE | Admitting: Internal Medicine

## 2018-06-21 ENCOUNTER — Encounter: Payer: Self-pay | Admitting: Internal Medicine

## 2018-06-21 DIAGNOSIS — J45991 Cough variant asthma: Secondary | ICD-10-CM | POA: Diagnosis not present

## 2018-06-21 MED ORDER — PREDNISONE 5 MG PO TABS: ORAL_TABLET | ORAL | 2 refills | Status: DC

## 2018-06-21 MED ORDER — PREDNISONE 5 MG (21) PO TBPK: ORAL_TABLET | ORAL | 2 refills | Status: DC

## 2018-06-21 MED ORDER — GABAPENTIN 100 MG PO CAPS: ORAL_CAPSULE | ORAL | Status: DC

## 2018-06-21 NOTE — Telephone Encounter (Signed)
Rx has been fixed and sent to [facility]. Nothing further was needed.

## 2018-06-21 NOTE — Progress Notes (Signed)
Jennifer Melton, female    DOB: 1927-08-01,     MRN: [id number]   Brief patient profile:  70 yowf Never smoker from [place] moved go [place] fall 2019 to live with daughter ? Childhood illness other whooping cough but no chronic resp problems with new onset x 2016 cough to point of vomiting daily year round so eval in [facility] first with allergies to trees and placed on shots no better and  So stopped 12/28/17 and rx Benralizumab 02/23/2018 by Dr Neldon Mc but remains "steroid dep" to control coughing so referred to pulmonary clinic 05/22/2018 by Dr   Neldon Mc      History of Present Illness  05/22/2018  Pulmonary/ 1st office eval/Jennifer Melton / last prednisone 2 days prior to OV  / on singulair/ laba/ics neb and prn duoneb and "lots of cough drops" Chief Complaint  Patient presents with  . Pulmonary Consult    Referred Jennifer Kand, NP. Pt c/o cough x 4 years- sometimes coughs until the point of vomiting. She is using her albuterol inhaler 3 x per day on average.   Dyspnea:  Doe = MMRC3 = can't walk 100 yards even at a slow pace at a flat grade s stopping due to sob  Cough: "tickle" starting to come again plast dose  prednisone a few days prior to OV   Sleep: on bed blocks/ cough some better noct than daytime but daughter notes some noct "wheze" SABA use: as above/ seems to helps though note baseline hfa very poor technique and now on maint laba/ics neb  rec Prednisone 10 mg take 2 daily until better, then 1 daily x 5 days and one half daily x 5 days and stop  Gabapentin 100 mg twice daily for now Continue performist and budesonide twice daily and stop ipatropium   Continue fosfenra per Dr Neldon Mc For cough > mucinex dm up to 1200 mg twice daily and flutter valve  GERD  Diet    . 06/21/2018  f/u ov/Jennifer Melton re: cough resp to prednisone/ worse since stopped  Chief Complaint  Patient presents with  . Follow-up    patient stopped the prednisone on 5-25 and is starting to get the congested cough back-non  productive  Dyspnea:  Inside the house / spends most of her time in chair due to R hip pain  Cough: dry tickle mostly  daytime  Sleeping: on bed blocks, no noct wheeze or cough  SABA use: no extra 02: no   No obvious day to day or daytime variability or assoc excess/ purulent sputum or mucus plugs or hemoptysis or cp or chest tightness, subjective wheeze or overt sinus or hb symptoms.   Sleeping  without nocturnal  or early am exacerbation  of respiratory  c/o's or need for noct saba. Also denies any obvious fluctuation of symptoms with weather or environmental changes or other aggravating or alleviating factors except as outlined above   No unusual exposure hx or h/o childhood pna/ asthma or knowledge of premature birth.  Current Allergies, Complete Past Medical History, Past Surgical History, Family History, and Social History were reviewed in [employer] record.  ROS  The following are not active complaints unless bolded Hoarseness, sore throat, dysphagia, dental problems, itching, sneezing,  nasal congestion or discharge of excess mucus or purulent secretions, ear ache,   fever, chills, sweats, unintended wt loss or wt gain, classically pleuritic or exertional cp,  orthopnea pnd or arm/hand swelling  or leg swelling, presyncope, palpitations, abdominal  pain, anorexia, nausea, vomiting, diarrhea  or change in bowel habits or change in bladder habits, change in stools or change in urine, dysuria, hematuria,  rash, arthralgias, visual complaints, headache, numbness, weakness or ataxia or problems with walking or coordination,  change in mood or  memory.        Current Meds  Medication Sig  . albuterol (PROVENTIL HFA) 108 (90 Base) MCG/ACT inhaler Inhale two puffs every four to six hours as needed for cough or wheeze.  Marland Kitchen albuterol (PROVENTIL) (2.5 MG/3ML) 0.083% nebulizer solution Take 2.5 mg by nebulization every 4 (four) hours as needed for wheezing or shortness of  breath.  Marland Kitchen aspirin EC 81 MG tablet Take 81 mg by mouth daily.  . budesonide (PULMICORT) 0.5 MG/2ML nebulizer solution Take 0.5 mg by nebulization 2 (two) times daily.  . famotidine (PEPCID) 40 MG tablet Take 1 tablet (40 mg total) by mouth at bedtime.  . fluticasone (FLONASE) 50 MCG/ACT nasal spray Place 1 spray into both nostrils 2 (two) times daily.  . formoterol (PERFOROMIST) 20 MCG/2ML nebulizer solution Take 20 mcg by nebulization 2 (two) times daily.  . furosemide (LASIX) 40 MG tablet Take 40 mg by mouth.  . gabapentin (NEURONTIN) 100 MG capsule One twice daily  . hydroxyurea (HYDREA) 500 MG capsule Take 500 mg by mouth daily. May take with food to minimize GI side effects.  1 capsule by mouth two times daily on Monday, Wednesday, and Friday. 1 capsule every other day once daily.  Marland Kitchen ipratropium (ATROVENT) 0.02 % nebulizer solution Take 0.5 mg by nebulization every 4 (four) hours as needed for wheezing or shortness of breath.  Marland Kitchen ipratropium (ATROVENT) 0.06 % nasal spray Can use two sprays in each nostril every six hours as needed to dry up runny nose.  . losartan (COZAAR) 50 MG tablet Take 50 mg by mouth daily.  . metoprolol succinate (TOPROL-XL) 25 MG 24 hr tablet Take 0.5 tablets (12.5 mg total) by mouth daily.  . montelukast (SINGULAIR) 10 MG tablet Take 1 tablet (10 mg total) by mouth at bedtime.  . Olopatadine HCl 0.6 % SOLN Place 1 spray into both nostrils 2 (two) times daily as needed (allergies).   Marland Kitchen PARoxetine (PAXIL) 40 MG tablet Take 40 mg by mouth daily.  Marland Kitchen Respiratory Therapy Supplies (FLUTTER) DEVI Use as directed  . simvastatin (ZOCOR) 20 MG tablet TAKE ONE TABLET BY MOUTH DAILY AT BEDTIME   .  Gabapentin 100 mg  One twice daily             Objective:     amb wf nad   Wt Readings from Last 3 Encounters:  06/21/18 148 lb (67.1 kg)  05/22/18 144 lb 9.6 oz (65.6 kg)  05/08/18 136 lb (61.7 kg)     Vital signs reviewed - Note on arrival 02 sats  97% on RA        HEENT: nl dentition, turbinates bilaterally, and oropharynx. Nl external ear canals without cough reflex   NECK :  without JVD/Nodes/TM/ nl carotid upstrokes bilaterally   LUNGS: no acc muscle use,  Nl contour chest which is clear to A and P bilaterally without cough on insp or exp maneuvers   CV:  RRR  no s3 or murmur or increase in P2, and no edema   ABD:  soft and nontender with nl inspiratory excursion in the supine position. No bruits or organomegaly appreciated, bowel sounds nl  MS:    ext warm without deformities, calf tenderness,  cyanosis or clubbing No obvious joint restrictions   SKIN: warm and dry without lesions    NEURO:  alert, approp, nl sensorium with  no motor or cerebellar deficits apparent.                  Assessment   Cough variant asthma Onset around 2016/? steroid dep  - Benralizumab started 02/23/2018 - MBS 04/2018 req from [facility]  - Gabapentin trial 100 mg bid 05/22/2018 >>>    DDX of  difficult airways management almost all start with A and  include Adherence, Ace Inhibitors, Acid Reflux, Active Sinus Disease, Alpha 1 Antitripsin deficiency, Anxiety masquerading as Airways dz,  ABPA,  Allergy(esp in young), Aspiration (esp in elderly), Adverse effects of meds,  Active smoking or vaping, A bunch of PE's (a small clot burden can't cause this syndrome unless there is already severe underlying pulm or vascular dz with poor reserve) plus two Bs  = Bronchiectasis and Beta blocker use..and one C= CHF   Adherence is always the initial "prime suspect" and is a multilayered concern that requires a "trust but verify" approach in every patient - starting with knowing how to use medications, especially inhalers, correctly, keeping up with refills and understanding the fundamental difference between maintenance and prns vs those medications only taken for a very short course and then stopped and not refilled.  - - The proper method of use, as well as anticipated  side effects, of a metered-dose inhaler are discussed and demonstrated to the patient. Improved effectiveness after extensive coaching during this visit to a level of approximately 0 % from a baseline of 0 %   -looks like her attentive daughter can help her with this and agree she can't use hfa  - return with all meds in hand using a trust but verify approach to confirm accurate Medication  Reconciliation The principal here is that until we are certain that the  patients are doing what we've asked, it makes no sense to ask them to do more.   ? Acid (or non-acid) GERD > always difficult to exclude as up to 75% of pts in some series report no assoc GI/ Heartburn symptoms> rec continue  max (24h)  acid suppression and diet restrictions/ reviewed and instructions given in writing.   NO COUGH DROPS - Of the three most common causes of  Sub-acute / recurrent or chronic cough, only one (GERD)  can actually contribute to/ trigger  the other two (asthma and post nasal drip syndrome)  and perpetuate the cylce of cough.  While not intuitively obvious, many patients with chronic low grade reflux do not cough until there is a primary insult that disturbs the protective epithelial barrier and exposes sensitive nerve endings.   This is typically viral but can due to PNDS and  either may apply here.    >>> The point is that once this occurs, it is difficult to eliminate the cycle  using anything but a maximally effective acid suppression regimen at least in the short run, accompanied by an appropriate diet to address non acid GERD and added flutter valve to help with traumatic cough/gag  to point of vomiting control / eliminate the "tickle" itself with gabapentin titrated to as high as 300 mg tid if needed/ tolerated (daughter is already on it and familiar with the drug)   ? Allergy  > continue fosrena per Dr Bruna Potter office though low threshold to d/c p 6 m if seeing no improvement and continue  lama/laba with prns saba  (thoughwould avoid atrovent for now)  - The goal with a chronic steroid dependent illness is always arriving at the lowest effective dose that controls the disease/symptoms and not accepting a set "formula" which is based on statistics or guidelines that don't always take into account patient  variability or the natural hx of the dz in every individual patient, which may well vary over time.  For now therefore I recommend the patient maintain  20 mg ceiling and floor of 0 to control this until other measures have had a chance to work  ? Active sinus dz > don't see a sinus CT or ent eval on record, defer to Dr Neldon Mc   ? Betablocker effects >  Low doses of toprol probably ok but if need higher: In the setting of respiratory symptoms of unknown etiology,  It would be preferable to use bystolic, the most beta -1  selective Beta blocker available in sample form, with bisoprolol the most selective generic choice  on the market, at least on a trial basis, to make sure the spillover Beta 2 effects of the less specific Beta blockers are not contributing to this patient's symptoms.   ? chf / cardiac asthma  > bnp nl during flare makes this less likely but she could still have ischemia related transient chf or exertional angina expressed as doe > f/u [facility]    >>>>Instructed on flutter valve to prevent cyclical coughing    >>> Would like to see her back in 4 weeks in office to complete the w/u.      Total time devoted to counseling  > 50 % of initial 60 min office visit:  review case with pt/daughter  device teaching which extended face to face time for this visit / discussion of options/alternatives/ personally creating written customized instructions  in presence of pt  then going over those specific  Instructions directly with the pt including how to use all of the meds but in particular covering each new medication in detail and the difference between the maintenance= "automatic" meds and the prns using  an action plan format for the latter (If this problem/symptom => do that organization reading Left to right).  Please see AVS from this visit for a full list of these instructions which I personally wrote for this pt and  are unique to this visit.        Christinia Gully, MD 05/22/2018

## 2018-06-21 NOTE — Patient Instructions (Addendum)
Prednisone 5 mg  X 2 daily until all better, then one daily until see Dr Neldon Mc    Let Dr Neldon Mc decide whether you need to return to [facility]

## 2018-06-23 ENCOUNTER — Encounter: Payer: Self-pay | Admitting: Cardiology

## 2018-06-23 ENCOUNTER — Ambulatory Visit (INDEPENDENT_AMBULATORY_CARE_PROVIDER_SITE_OTHER): Payer: MEDICARE | Admitting: Cardiology

## 2018-06-23 ENCOUNTER — Other Ambulatory Visit: Payer: Self-pay

## 2018-06-23 VITALS — BP 136/62 | HR 72 | Temp 98.3°F | Ht 63.0 in | Wt 148.0 lb

## 2018-06-23 DIAGNOSIS — N183 Chronic kidney disease, stage 3 unspecified: Secondary | ICD-10-CM

## 2018-06-23 DIAGNOSIS — I5032 Chronic diastolic (congestive) heart failure: Secondary | ICD-10-CM

## 2018-06-23 DIAGNOSIS — R0609 Other forms of dyspnea: Secondary | ICD-10-CM

## 2018-06-23 DIAGNOSIS — I10 Essential (primary) hypertension: Secondary | ICD-10-CM | POA: Diagnosis not present

## 2018-06-23 MED ORDER — TORSEMIDE 20 MG PO TABS: 20.0000 mg | ORAL_TABLET | Freq: Two times a day (BID) | ORAL | 3 refills | Status: DC

## 2018-06-23 MED ORDER — TORSEMIDE 20 MG PO TABS: 20.0000 mg | ORAL_TABLET | ORAL | 3 refills | Status: DC

## 2018-06-23 NOTE — Progress Notes (Signed)
Subjective:  Primary Physician:  Algis Greenhouse, MD  Patient ID: Jennifer Melton, female    DOB: [birth year], 85 y.o.   MRN: [id number]  Chief Complaint  Patient presents with  . Shortness of Breath    6 WEEK F/U     HPI: Jennifer Melton  is a 85 y.o. female  with moderate persistent asthma, GERD, hypothyroidism, polycythemia vera, due to sudden onset of episode of nausea and vomiting and lightheadedness was admitted to [facility] on 01/03/2018 and was found to have normal LVEF by echocardiogram, asymptomatic NSVT on telemetry, was started on low-dose metoprolol, symptoms felt to be vasovagal and discharged home.  Due to progressively worsening dyspnea and decreased physical activity, mild fatigue, in spite of being 85 years of age she underwent nuclear stress test on 01/20/2018 and was found to have high risk study with mild intensity inferior reversible ischemia. She was started on statin and Imdur at her office visit 2 weeks ago. Advised to use lasix as needed.  Patient does have dementia; therefore, her daughter is providing the majority of the information. Patient with treatment with steroids, has noticed improvement in dyspnea and also cough.  Recently they have noticed that slight increase in weight gain and furosemide not working as well has dictated previously with decreased urine output recent weight gain  Past Medical History:  Diagnosis Date  . Acute kidney injury ([place])   . Asthma   . Bilateral carotid bruits   . Dementia ([place])   . Dementia ([place])   . Dyspnea on exertion   . Heart murmur   . High blood pressure   . LBBB (left bundle branch block)   . Left bundle branch block   . Near syncope 01/04/2018  . Polycythemia   . Polycythemia vera ([place])   . Vomiting 01/03/2018    Past Surgical History:  Procedure Laterality Date  . CYST EXCISION     Cyst removal on left breast  . HEMORRHOID SURGERY      Social History   Socioeconomic History  . Marital  status: Widowed    Spouse name: Not on file  . Number of children: 4  . Years of education: Not on file  . Highest education level: Not on file  Occupational History  . Not on file  Social Needs  . Financial resource strain: Not on file  . Food insecurity:    Worry: Not on file    Inability: Not on file  . Transportation needs:    Medical: Not on file    Non-medical: Not on file  Tobacco Use  . Smoking status: Passive Smoke Exposure - Never Smoker  . Smokeless tobacco: Never Used  Substance and Sexual Activity  . Alcohol use: Never    Frequency: Never  . Drug use: Never  . Sexual activity: Not on file  Lifestyle  . Physical activity:    Days per week: Not on file    Minutes per session: Not on file  . Stress: Not on file  Relationships  . Social connections:    Talks on phone: Not on file    Gets together: Not on file    Attends religious service: Not on file    Active member of club or organization: Not on file    Attends meetings of clubs or organizations: Not on file    Relationship status: Not on file  . Intimate partner violence:    Fear of current or ex partner:  Not on file    Emotionally abused: Not on file    Physically abused: Not on file    Forced sexual activity: Not on file  Other Topics Concern  . Not on file  Social History Narrative  . Not on file    Current Outpatient Medications on File Prior to Visit  Medication Sig Dispense Refill  . albuterol (PROVENTIL HFA) 108 (90 Base) MCG/ACT inhaler Inhale two puffs every four to six hours as needed for cough or wheeze. 1 Inhaler 1  . albuterol (PROVENTIL) (2.5 MG/3ML) 0.083% nebulizer solution Take 2.5 mg by nebulization every 4 (four) hours as needed for wheezing or shortness of breath.    Marland Kitchen aspirin EC 81 MG tablet Take 81 mg by mouth daily.    . budesonide (PULMICORT) 0.5 MG/2ML nebulizer solution Take 0.5 mg by nebulization 2 (two) times daily.    . famotidine (PEPCID) 40 MG tablet Take 1 tablet (40 mg  total) by mouth at bedtime. 30 tablet 5  . fluticasone (FLONASE) 50 MCG/ACT nasal spray Place 1 spray into both nostrils 2 (two) times daily.    . formoterol (PERFOROMIST) 20 MCG/2ML nebulizer solution Take 20 mcg by nebulization 2 (two) times daily.    Marland Kitchen gabapentin (NEURONTIN) 100 MG capsule One three times a day (Patient taking differently: Take 100 mg by mouth 2 (two) times daily. One three times a day)    . hydroxyurea (HYDREA) 500 MG capsule Take 500 mg by mouth daily. May take with food to minimize GI side effects.  1 capsule by mouth two times daily on Monday, Wednesday, and Friday. 1 capsule every other day once daily.    Marland Kitchen losartan (COZAAR) 50 MG tablet Take 50 mg by mouth daily.    . Melatonin 10 MG TBDP Take by mouth as needed.     . metoprolol succinate (TOPROL-XL) 25 MG 24 hr tablet Take 0.5 tablets (12.5 mg total) by mouth daily. 45 tablet 1  . montelukast (SINGULAIR) 10 MG tablet Take 1 tablet (10 mg total) by mouth at bedtime. 90 tablet 1  . PARoxetine (PAXIL) 40 MG tablet Take 40 mg by mouth daily.    . predniSONE (DELTASONE) 5 MG tablet Take 2 tablets until better then take one daily 100 tablet 2  . Respiratory Therapy Supplies (FLUTTER) DEVI Use as directed 1 each 0  . simvastatin (ZOCOR) 20 MG tablet TAKE ONE TABLET BY MOUTH DAILY AT BEDTIME  30 tablet 1  . ipratropium (ATROVENT) 0.02 % nebulizer solution Take 0.5 mg by nebulization every 4 (four) hours as needed for wheezing or shortness of breath.    Marland Kitchen ipratropium (ATROVENT) 0.06 % nasal spray Can use two sprays in each nostril every six hours as needed to dry up runny nose. (Patient not taking: Reported on 06/23/2018) 15 mL 5  . isosorbide mononitrate (IMDUR) 30 MG 24 hr tablet TAKE ONE TABLET BY MOUTH ONE TIME DAILY  (Patient not taking: Reported on 06/21/2018) 30 tablet 1  . Olopatadine HCl 0.6 % SOLN Place 1 spray into both nostrils 2 (two) times daily as needed (allergies).     . predniSONE (DELTASONE) 10 MG tablet Take 1  tablet by mouth as directed.    . predniSONE (STERAPRED UNI-PAK 21 TAB) 5 MG (21) TBPK tablet 2 until better take both with breakfast and then once daily (Patient not taking: Reported on 06/23/2018) 100 tablet 2   No current facility-administered medications on file prior to visit.    Review of  Systems  Constitutional: Positive for malaise/fatigue. Negative for weight loss.  Respiratory: Positive for cough, shortness of breath and wheezing. Negative for hemoptysis.   Cardiovascular: Negative for chest pain, palpitations, claudication and leg swelling.  Gastrointestinal: Negative for abdominal pain, blood in stool, constipation, heartburn and vomiting.  Genitourinary: Negative for dysuria.  Musculoskeletal: Negative for joint pain and myalgias.  Neurological: Negative for dizziness, focal weakness and headaches.  Endo/Heme/Allergies: Does not bruise/bleed easily.  Psychiatric/Behavioral: Negative for depression. The patient is not nervous/anxious.   All other systems reviewed and are negative.     Objective:  Blood pressure 136/62, pulse 72, temperature 98.3 F (36.8 C), height 5\' 3"  (1.6 m), weight 148 lb (67.1 kg), SpO2 98 %. Body mass index is 26.22 kg/m.  Physical Exam  Constitutional: She appears well-developed. No distress.  Petite  HENT:  Head: Atraumatic.  Eyes: Conjunctivae are normal.  Neck: Neck supple. No JVD present. No thyromegaly present.  Cardiovascular: Normal rate, regular rhythm, normal heart sounds, intact distal pulses and normal pulses. Exam reveals no gallop.  No murmur heard. Pulses:      Carotid pulses are on the right side with bruit and on the left side with bruit. Bilateral varicose veins noted.  Pulmonary/Chest: Effort normal and breath sounds normal.  Abdominal: Soft. Bowel sounds are normal.  Musculoskeletal: Normal range of motion.        General: No edema.  Neurological: She is alert.  Skin: Skin is warm and dry.  Psychiatric: She has a normal  mood and affect.   CARDIAC STUDIES:   Echo at [facility] 12/31/2017:  Left ventricle: The cavity size was normal. Systolic function was normal. The estimated ejection fraction was in the range of 55% to 60%. Wall motion was normal; there were no regional wall motion abnormalities. Doppler parameters are consistent with abnormal left ventricular relaxation (grade 1 diastolic dysfunction). - Aortic valve: There was mild regurgitation. - Mitral valve: There was mild regurgitation. - Left atrium: The atrium was mildly dilated. - Right atrium: The atrium was mildly dilated.  Carotid Doppler  Carotid artery duplex 01/23/2018: No hemodynamically significant arterial disease in the right internal carotid artery. No hemodynamically significant arterial disease in the left internal carotid artery. Minimal plaque noted. Antegrade right vertebral artery flow. Antegrade left vertebral artery flow.  Nuclear stress test  Lexiscan myoview stress test 01/20/2018: 1. Lexiscan stress test was performed. Exercise capacity was not assessed. Stress symptoms included headache. Blood pressure was normal. The resting and stress electrocardiogram demonstrated normal sinus rhythm, first degree AV block, LBBB, occasional VPC and nonspecific ST-T changes in inferolateral leads. Stress EKG is non diagnostic for ischemia as it is a pharmacologic stress. 2. The overall quality of the study is good. Left ventricular cavity is enlarged on the rest and stress studies. Gated SPECT images reveal normal myocardial thickening and wall motion. The left ventricular ejection fraction was calculated 33%, although visually appears normal. SPECT images reveal very small sized, mild intensity, mildly reversible perfusion defect in basal inferior/inferoseptal myocardium. 3. High risk study due to reduced LVEF. Clinical correlation recommended.  Assessment & Recommendations:   Chronic diastolic heart failure (HCC) - Plan: torsemide  (DEMADEX) 20 MG tablet, DISCONTINUED: Furosemide  Dyspnea on exertion  Essential hypertension  Chronic kidney disease, stage 3 (HCC) EKG 01/02/2018: Sinus rhythm with first-degree AV block at rate of 68 bpm, left atrial enlargement, left bundle branch block.  No further analysis.   Recommendation: Patient is here on a 4 week follow-up visit  of abnormal nuclear stress test, dyspnea on exertion, and fatigue.  Medical management has been recommended in view of her advanced age and high risk for cardioembolic phenomena unless persistent symptoms.   Patient's symptoms of dyspnea are multifactorial including underlying current, deconditioning, chronic diastolic heart failure symptoms improved since steroid treatment suggesting pulmonary etiology more than cardiac etiology.  Blood pressure is well controlled, no clinical evidence of congestive heart failure, she probably does have chronic diastolic heart failure in view of her advanced age and hypertension.  As patient and her daughter has noticed decreased responsiveness to furosemide was changed to torsemide.  I'll see her back in 3 months for follow-up.  Counseling regarding heart failure, diet, medical treatment of CAD and management of stress and anxiety discussed with the patient and her daughter.  This was a 25 minute encounter with greater than 50% time spent face-to-face encounter.  Adrian Prows, MD, [facility] 06/24/2018, 9:29 PM [facility]. [place] Pager: [phone] Office: [phone] If no answer Cell [phone]

## 2018-06-24 ENCOUNTER — Encounter: Payer: Self-pay | Admitting: Cardiology

## 2018-06-24 ENCOUNTER — Encounter: Payer: Self-pay | Admitting: Internal Medicine

## 2018-06-24 NOTE — Assessment & Plan Note (Addendum)
Onset around 2016/? steroid dep  - Benralizumab started 02/23/2018 - MBS 05/15/18 req from [facility] > nl MBS but noted prominence upper esophagus @  C4 ? prominent cricopharyngeus?  - Gabapentin 100 mg bid 05/22/2018 >>>  - 06/21/2018 rec maint pred = 5 mg floor as cough flares each time it's stopped despite rx with budesonide 0.5 mg bid and singulair    The goal with a chronic steroid dependent illness is always arriving at the lowest effective dose that controls the disease/symptoms and not accepting a set "formula" which is based on statistics or guidelines that don't always take into account patient  variability or the natural hx of the dz in every individual patient, which may well vary over time.  For now therefore I recommend the patient maintain  Ceiling of pred 10 mg daily and floor of 5 mg daily as continues to flare with taper to 0  >>>> No other options at this point > defer whether needs to return to pulmonary clinic  to Dr Bruna Potter judgement   I had an extended discussion with the patient reviewing all relevant studies completed to date and  lasting 15 to 20 minutes of a 25 minute visit    Each maintenance medication was reviewed in detail including most importantly the difference between maintenance and prns and under what circumstances the prns are to be triggered using an action plan format that is not reflected in the computer generated alphabetically organized AVS.     Please see AVS for specific instructions unique to this visit that I personally wrote and verbalized to the the pt in detail and then reviewed with pt  by my nurse highlighting any  changes in therapy recommended at today's visit to their plan of care.

## 2018-06-26 ENCOUNTER — Telehealth: Payer: Self-pay | Admitting: *Deleted

## 2018-06-26 NOTE — Telephone Encounter (Signed)
ATC,NA and there was no option to leave msg

## 2018-06-26 NOTE — Telephone Encounter (Signed)
-----   Message from Tanda Rockers, MD sent at 06/24/2018  7:34 AM EDT ----- Typo on avs should have said Prednisone 5 mg  X 2 daily until all better, then one daily until see Dr Neldon Mc

## 2018-06-28 NOTE — Telephone Encounter (Signed)
Called & spoke w/ pt daughter, Jennifer Melton, (on [place]) to inform her of [name] message. Pt daughter verbalized understanding and confirmed that Prednisone 5 mg X 2 daily until all better, then one daily until pt sees Dr. Neldon Mc was on the AVS. Pt daughter also stated that she had an appt set up for pt w/ Dr. Neldon Mc.   Typo has been resolved. Pt daughter had no further questions or concerns. Nothing further needed at this time.

## 2018-07-03 ENCOUNTER — Encounter: Payer: Self-pay | Admitting: Allergy and Immunology

## 2018-07-03 ENCOUNTER — Ambulatory Visit (INDEPENDENT_AMBULATORY_CARE_PROVIDER_SITE_OTHER): Payer: MEDICARE | Admitting: Allergy and Immunology

## 2018-07-03 ENCOUNTER — Other Ambulatory Visit: Payer: Self-pay

## 2018-07-03 VITALS — BP 140/72 | HR 89 | Temp 98.0°F | Resp 16

## 2018-07-03 DIAGNOSIS — I509 Heart failure, unspecified: Secondary | ICD-10-CM | POA: Diagnosis not present

## 2018-07-03 DIAGNOSIS — J3089 Other allergic rhinitis: Secondary | ICD-10-CM

## 2018-07-03 DIAGNOSIS — D45 Polycythemia vera: Secondary | ICD-10-CM

## 2018-07-03 DIAGNOSIS — J4550 Severe persistent asthma, uncomplicated: Secondary | ICD-10-CM | POA: Diagnosis not present

## 2018-07-03 DIAGNOSIS — K219 Gastro-esophageal reflux disease without esophagitis: Secondary | ICD-10-CM

## 2018-07-03 MED ORDER — METHYLPREDNISOLONE ACETATE 80 MG/ML IJ SUSP
80.0000 mg | Freq: Once | INTRAMUSCULAR | Status: AC
  Administered 2018-07-03: 17:00:00 80 mg via INTRAMUSCULAR

## 2018-07-03 NOTE — Progress Notes (Signed)
Jennifer Melton - Jennifer Melton - Jennifer Melton   Follow-up Note  Referring Provider: Algis Greenhouse, MD Primary Provider: Algis Greenhouse, MD Date of Office Visit: 07/03/2018  Subjective:   Jennifer Melton (DOB: 12/05/1927) is a 85 y.o. female who returns to the [name] on 07/03/2018 in re-evaluation of the following:  HPI: Jennifer Melton returns to this clinic in reevaluation of her asthma and allergic rhinitis and history of reflux induced respiratory disease and history of diastolic dysfunction and coronary artery disease followed by Dr. Einar Melton and polycythemia vera followed by Dr. Bobby Melton.  I last saw her in this clinic on 10 May 2018.  During the interval she has seen Dr. Melvyn Melton and has been placed on daily systemic steroids.  She was doing very well until she went to a trip to [place] 1 week ago and none of her medications were administered during the trip and she came back coughing her head off.  She has not had any fever or ugly sputum production or significant nasal symptoms or reflux symptoms.  She has not been having any choking episodes recently.  Most of the history today was provided by her daughter as Jennifer Melton has a very bad hearing loss and also has dementia.  Allergies as of 07/03/2018      Reactions   Ciprofloxacin Other (See Comments)   unknown   Penicillins Other (See Comments)   unknown   Pseudoephedrine Other (See Comments)   unknown      Medication List      albuterol (2.5 MG/3ML) 0.083% nebulizer solution Commonly known as:  PROVENTIL Take 2.5 mg by nebulization every 4 (four) hours as needed for wheezing or shortness of breath.   albuterol 108 (90 Base) MCG/ACT inhaler Commonly known as:  Proventil HFA Inhale two puffs every four to six hours as needed for cough or wheeze.   aspirin EC 81 MG tablet Take 81 mg by mouth daily.   budesonide 0.5 MG/2ML nebulizer solution Commonly known as:  PULMICORT Take 0.5 mg by nebulization  2 (two) times daily.   famotidine 40 MG tablet Commonly known as:  PEPCID Take 1 tablet (40 mg total) by mouth at bedtime.   fluticasone 50 MCG/ACT nasal spray Commonly known as:  FLONASE Place 1 spray into both nostrils 2 (two) times daily.   Flutter Devi Use as directed   gabapentin 100 MG capsule Commonly known as:  Neurontin One three times a day      hydroxyurea 500 MG capsule Commonly known as:  HYDREA Take 500 mg by mouth daily. May take with food to minimize GI side effects.  1 capsule by mouth two times daily on Monday, Wednesday, and Friday. 1 capsule every other day once daily.   ipratropium 0.02 % nebulizer solution Commonly known as:  ATROVENT Take 0.5 mg by nebulization every 4 (four) hours as needed for wheezing or shortness of breath.   ipratropium 0.06 % nasal spray Commonly known as:  ATROVENT Can use two sprays in each nostril every six hours as needed to dry up runny nose.   isosorbide mononitrate 30 MG 24 hr tablet Commonly known as:  IMDUR TAKE ONE TABLET BY MOUTH ONE TIME DAILY   losartan 50 MG tablet Commonly known as:  COZAAR Take 50 mg by mouth daily.   Melatonin 10 MG Tbdp Take by mouth as needed.   metoprolol succinate 25 MG 24 hr tablet Commonly known as:  TOPROL-XL Take 0.5  tablets (12.5 mg total) by mouth daily.   montelukast 10 MG tablet Commonly known as:  SINGULAIR Take 1 tablet (10 mg total) by mouth at bedtime.   Olopatadine HCl 0.6 % Soln Place 1 spray into both nostrils 2 (two) times daily as needed (allergies).   PARoxetine 40 MG tablet Commonly known as:  PAXIL Take 40 mg by mouth daily.   Perforomist 20 MCG/2ML nebulizer solution Generic drug:  formoterol Take 20 mcg by nebulization 2 (two) times daily.   predniSONE 10 MG tablet Commonly known as:  DELTASONE Take 1 tablet by mouth as directed.   predniSONE 5 MG tablet Commonly known as:  DELTASONE    simvastatin 20 MG tablet Commonly known as:  ZOCOR TAKE  ONE TABLET BY MOUTH DAILY AT BEDTIME   torsemide 20 MG tablet Commonly known as:  DEMADEX Take 1 tablet (20 mg total) by mouth every morning.       Past Medical History:  Diagnosis Date  . Acute kidney injury ([place])   . Asthma   . Bilateral carotid bruits   . Dementia ([place])   . Dementia ([place])   . Dyspnea on exertion   . Heart murmur   . Jennifer blood pressure   . LBBB (left bundle branch block)   . Left bundle branch block   . Near syncope 01/04/2018  . Polycythemia   . Polycythemia vera ([place])   . Vomiting 01/03/2018    Past Surgical History:  Procedure Laterality Date  . CYST EXCISION     Cyst removal on left breast  . HEMORRHOID SURGERY      Review of systems negative except as noted in HPI / PMHx or noted below:  Review of Systems  Constitutional: Negative.   HENT: Negative.   Eyes: Negative.   Respiratory: Negative.   Cardiovascular: Negative.   Gastrointestinal: Negative.   Genitourinary: Negative.   Musculoskeletal: Negative.   Skin: Negative.   Neurological: Negative.   Endo/Heme/Allergies: Negative.   Psychiatric/Behavioral: Negative.      Objective:   Vitals:   07/03/18 1631  BP: 140/72  Pulse: 89  Resp: 16  Temp: 98 F (36.7 C)  SpO2: 97%          Physical Exam Constitutional:      Appearance: She is not diaphoretic.  HENT:     Head: Normocephalic.     Right Ear: Tympanic membrane, ear canal and external ear normal.     Left Ear: Tympanic membrane, ear canal and external ear normal.     Nose: Nose normal. No mucosal edema or rhinorrhea.     Mouth/Throat:     Pharynx: Uvula midline. No oropharyngeal exudate.  Eyes:     Conjunctiva/sclera: Conjunctivae normal.  Neck:     Thyroid: No thyromegaly.     Trachea: Trachea normal. No tracheal tenderness or tracheal deviation.  Cardiovascular:     Rate and Rhythm: Normal rate and regular rhythm.     Heart sounds: Normal heart sounds, S1 normal and S2 normal. No murmur.  Pulmonary:      Effort: No respiratory distress.     Breath sounds: No stridor. Wheezing (Bilateral expiratory wheezes all lung fields) present. No rales.  Lymphadenopathy:     Head:     Right side of head: No tonsillar adenopathy.     Left side of head: No tonsillar adenopathy.     Cervical: No cervical adenopathy.  Skin:    Findings: No erythema or rash.  Nails: There is no clubbing.   Neurological:     Mental Status: She is alert.     Diagnostics:    Spirometry was performed and demonstrated an FEV1 of 0.88 at 69 % of predicted.    Assessment and Plan:   1. Not well controlled severe persistent asthma   2. Perennial allergic rhinitis   3. LPRD (laryngopharyngeal reflux disease)   4. Chronic congestive heart failure, unspecified heart failure type ([place])   5. Polycythemia vera ([place])     1. Continue to treat inflammation:   A.  Budesonide 0.5 mg nebulized twice a day  B.  Perforomist nebulized twice a day  C.  Fluticasone - 1 spray each nostril 1 times per day  D.  Montelukast 10mg  - 1 tablet one time per day  E.  Benralizumab injection   2. Continue to Treat reflux:   A. Omeprazole 40mg  tablet twice a day   B. Famotidine 40mg  tablet in PM  3. If Needed:   A. Proventil HFA - 2 inhalations every 4-6 hours  B. Duoneb nebulization every 4-6 hours   C. Patanase - 2 sprays each nostril 2 times per day    4. Prednisone 5 mg tablet - 2 tablets daily until return to clinic  5. Depomedrol 80 IM delivered in clinic today  6. Return to clinic in 4 weeks or earlier if problem  Lamaria appears to be in a period of significant inflammation of her airway once again which is probably a reflection of stopping all of her medications while she visited in [place].  We will place her back on all anti-inflammatory medications for her airway and all therapy directed against reflux and give her systemic steroids as noted above.  I would like for her to maintain on 10 mg daily prednisone until  she returns to this clinic.  We will attempt to taper her down to the lowest possible dose that will maintain good control of her coughing.  I will see her back in this clinic in 4 weeks or earlier if there is a problem.  Allena Katz, MD Allergy / Immunology [place]

## 2018-07-03 NOTE — Patient Instructions (Addendum)
  1. Continue to treat inflammation:   A.  Budesonide 0.5 mg nebulized twice a day  B.  Perforomist nebulized twice a day  C.  Fluticasone - 1 spray each nostril 1 times per day  D.  Montelukast 10mg  - 1 tablet one time per day  E.  Benralizumab injection   2. Continue to Treat reflux:   A. Omeprazole 40mg  tablet twice a day   B. Famotidine 40mg  tablet in PM  3. If Needed:   A. Proventil HFA - 2 inhalations every 4-6 hours  B. Duoneb nebulization every 4-6 hours   C. Patanase - 2 sprays each nostril 2 times per day    4. Prednisone 5 mg tablet - 2 tablets daily until return to clinic  5. Depomedrol 80 IM delivered in clinic today  6. Return to clinic in 4 weeks or earlier if problem

## 2018-07-04 ENCOUNTER — Encounter: Payer: Self-pay | Admitting: Allergy and Immunology

## 2018-07-07 DIAGNOSIS — D45 Polycythemia vera: Secondary | ICD-10-CM | POA: Diagnosis not present

## 2018-07-18 DIAGNOSIS — J4550 Severe persistent asthma, uncomplicated: Secondary | ICD-10-CM

## 2018-07-19 ENCOUNTER — Ambulatory Visit (INDEPENDENT_AMBULATORY_CARE_PROVIDER_SITE_OTHER): Payer: MEDICARE | Admitting: *Deleted

## 2018-07-19 ENCOUNTER — Other Ambulatory Visit: Payer: Self-pay

## 2018-07-19 DIAGNOSIS — J4550 Severe persistent asthma, uncomplicated: Secondary | ICD-10-CM

## 2018-07-19 MED ORDER — BENRALIZUMAB 30 MG/ML ~~LOC~~ SOSY
30.0000 mg | PREFILLED_SYRINGE | SUBCUTANEOUS | Status: AC
  Administered 2018-07-19 – 2018-09-18 (×2): 30 mg via SUBCUTANEOUS

## 2018-07-24 ENCOUNTER — Other Ambulatory Visit: Payer: Self-pay | Admitting: Allergy and Immunology

## 2018-07-27 ENCOUNTER — Encounter: Payer: Self-pay | Admitting: Allergy and Immunology

## 2018-07-27 ENCOUNTER — Ambulatory Visit (INDEPENDENT_AMBULATORY_CARE_PROVIDER_SITE_OTHER): Payer: MEDICARE | Admitting: Allergy and Immunology

## 2018-07-27 ENCOUNTER — Other Ambulatory Visit: Payer: Self-pay

## 2018-07-27 VITALS — BP 142/52 | HR 68 | Temp 98.0°F | Resp 18

## 2018-07-27 DIAGNOSIS — K219 Gastro-esophageal reflux disease without esophagitis: Secondary | ICD-10-CM

## 2018-07-27 DIAGNOSIS — J3089 Other allergic rhinitis: Secondary | ICD-10-CM | POA: Diagnosis not present

## 2018-07-27 DIAGNOSIS — J4550 Severe persistent asthma, uncomplicated: Secondary | ICD-10-CM | POA: Diagnosis not present

## 2018-07-27 MED ORDER — OMEPRAZOLE 40 MG PO CPDR: DELAYED_RELEASE_CAPSULE | ORAL | 1 refills | Status: AC

## 2018-07-27 MED ORDER — FAMOTIDINE 40 MG PO TABS: ORAL_TABLET | ORAL | 1 refills | Status: AC

## 2018-07-27 MED ORDER — PREDNISONE 5 MG PO TABS: ORAL_TABLET | ORAL | 1 refills | Status: AC

## 2018-07-27 NOTE — Progress Notes (Signed)
[facility] [place]   Follow-up Note  Referring Provider: Algis Greenhouse, MD Primary Provider: Algis Greenhouse, MD Date of Office Visit: 07/27/2018  Subjective:   Jennifer Melton (DOB: 1927-09-19) is a 85 y.o. female who returns to the [facility] on 07/27/2018 in re-evaluation of the following:  HPI: Ben returns to this clinic in evaluation of asthma and allergic rhinitis and reflux induced respiratory disease and a history of diastolic dysfunction with coronary artery disease and polycythemia vera.  I last saw her in this clinic on 03 July 2018 at which point in time she was having an asthma exacerbation with bilateral expiratory wheezing in all lung fields for which we gave her systemic steroid.  The plan was to have her continue on daily systemic steroids at a relatively low dose.  As expected, she has resolved her cough and her wheezing and she feels great.  She is currently using prednisone 10 mg daily as well as her other anti-inflammatory medications.  She has had no issues with reflux at this point in time.  She has not been having any of her choking episodes.  She did develop a sore throat today.  However, she just ate a [employer] hamburger at lunch today with no problem whatsoever and does not have any associated systemic or constitutional symptoms or fever.  She is followed by Dr. Einar Gip for her coronary artery disease and diastolic dysfunction and by Dr. Bobby Rumpf for her polycythemia vera vera which apparently are under pretty good control.  Allergies as of 07/27/2018      Reactions   Ciprofloxacin Other (See Comments)   unknown   Penicillins Other (See Comments)   unknown   Pseudoephedrine Other (See Comments)   unknown      Medication List      albuterol (2.5 MG/3ML) 0.083% nebulizer solution Commonly known as: PROVENTIL Take 2.5 mg by nebulization every 4 (four) hours as needed for wheezing or shortness of  breath.   albuterol 108 (90 Base) MCG/ACT inhaler Commonly known as: Proventil HFA Inhale two puffs every four to six hours as needed for cough or wheeze.   aspirin EC 81 MG tablet Take 81 mg by mouth daily.   budesonide 0.5 MG/2ML nebulizer solution Commonly known as: PULMICORT Take 0.5 mg by nebulization 2 (two) times daily.   famotidine 40 MG tablet Commonly known as: PEPCID Take one tablet every evening   fluticasone 50 MCG/ACT nasal spray Commonly known as: FLONASE Place 1 spray into both nostrils 2 (two) times daily.   Flutter Devi Use as directed   gabapentin 100 MG capsule Commonly known as: Neurontin One three times a day   hydroxyurea 500 MG capsule Commonly known as: HYDREA Take 500 mg by mouth daily. May take with food to minimize GI side effects.  1 capsule by mouth two times daily on Monday, Wednesday, and Friday. 1 capsule every other day once daily.   ipratropium 0.02 % nebulizer solution Commonly known as: ATROVENT Take 0.5 mg by nebulization every 4 (four) hours as needed for wheezing or shortness of breath.   ipratropium 0.06 % nasal spray Commonly known as: ATROVENT Can use two sprays in each nostril every six hours as needed to dry up runny nose.   isosorbide mononitrate 30 MG 24 hr tablet Commonly known as: IMDUR TAKE ONE TABLET BY MOUTH ONE TIME DAILY   losartan 50 MG tablet Commonly known as: COZAAR Take 50  mg by mouth daily.   Melatonin 10 MG Tbdp Take by mouth as needed.   metoprolol succinate 25 MG 24 hr tablet Commonly known as: TOPROL-XL Take 0.5 tablets (12.5 mg total) by mouth daily.   montelukast 10 MG tablet Commonly known as: SINGULAIR TAKE ONE TABLET BY MOUTH AT BEDTIME   Olopatadine HCl 0.6 % Soln Place 1 spray into both nostrils 2 (two) times daily as needed (allergies).   omeprazole 40 MG capsule Commonly known as: PriLOSEC Take one capsule twice daily Started by: ERIC Kevan Rosebush, MD   PARoxetine 40 MG tablet  Commonly known as: PAXIL Take 40 mg by mouth daily.   Perforomist 20 MCG/2ML nebulizer solution Generic drug: formoterol Take 20 mcg by nebulization 2 (two) times daily.   predniSONE 5 MG tablet Commonly known as: DELTASONE Take 2 tablets until better then take one daily   simvastatin 20 MG tablet Commonly known as: ZOCOR TAKE ONE TABLET BY MOUTH DAILY AT BEDTIME   torsemide 20 MG tablet Commonly known as: DEMADEX Take 1 tablet (20 mg total) by mouth every morning.       Past Medical History:  Diagnosis Date  . Acute kidney injury ([place])   . Asthma   . Bilateral carotid bruits   . Dementia ([place])   . Dementia ([place])   . Dyspnea on exertion   . Heart murmur   . High blood pressure   . LBBB (left bundle branch block)   . Left bundle branch block   . Near syncope 01/04/2018  . Polycythemia   . Polycythemia vera ([place])   . Vomiting 01/03/2018    Past Surgical History:  Procedure Laterality Date  . CYST EXCISION     Cyst removal on left breast  . HEMORRHOID SURGERY      Review of systems negative except as noted in HPI / PMHx or noted below:  Review of Systems  Constitutional: Negative.   HENT: Negative.   Eyes: Negative.   Respiratory: Negative.   Cardiovascular: Negative.   Gastrointestinal: Negative.   Genitourinary: Negative.   Musculoskeletal: Negative.   Skin: Negative.   Neurological: Negative.   Endo/Heme/Allergies: Negative.   Psychiatric/Behavioral: Negative.      Objective:   Vitals:   07/27/18 1633  BP: (!) 142/52  Pulse: 68  Resp: 18  Temp: 98 F (36.7 C)  SpO2: 96%          Physical Exam Constitutional:      Appearance: She is not diaphoretic.  HENT:     Head: Normocephalic.     Right Ear: Tympanic membrane, ear canal and external ear normal.     Left Ear: Tympanic membrane, ear canal and external ear normal.     Nose: Nose normal. No mucosal edema or rhinorrhea.     Mouth/Throat:     Pharynx: Uvula midline. No  oropharyngeal exudate.  Eyes:     Conjunctiva/sclera: Conjunctivae normal.  Neck:     Thyroid: No thyromegaly.     Trachea: Trachea normal. No tracheal tenderness or tracheal deviation.  Cardiovascular:     Rate and Rhythm: Normal rate and regular rhythm.     Heart sounds: S1 normal and S2 normal. Murmur (Systolic) present.  Pulmonary:     Effort: No respiratory distress.     Breath sounds: Normal breath sounds. No stridor. No wheezing or rales.  Lymphadenopathy:     Head:     Right side of head: No tonsillar adenopathy.     Left  side of head: No tonsillar adenopathy.     Cervical: No cervical adenopathy.  Skin:    Findings: No erythema or rash.     Nails: There is no clubbing.   Neurological:     Mental Status: She is alert.     Diagnostics:    Spirometry was performed and demonstrated an FEV1 of 1.07 at 84 % of predicted.  Assessment and Plan:   1. Asthma, severe persistent, well-controlled   2. Perennial allergic rhinitis   3. LPRD (laryngopharyngeal reflux disease)     1. Continue to treat inflammation:   A.  Budesonide 0.5 mg nebulized twice a day  B.  Perforomist nebulized twice a day  C.  Fluticasone - 1 spray each nostril 1 times per day  D.  Montelukast 10mg  - 1 tablet one time per day  E.  Benralizumab injection   2. Continue to Treat reflux:   A. Omeprazole 40mg  tablet twice a day   B. Famotidine 40mg  tablet in PM  3. If Needed:   A. Proventil HFA - 2 inhalations every 4-6 hours  B. Duoneb nebulization every 4-6 hours   C. Patanase - 2 sprays each nostril 2 times per day    4. DECREASE Prednisone 5 mg tablet - 1+1/2 tablets daily until return to clinic  5. Therapy for sore throat?  6. Return to clinic in 4 weeks or earlier if problem  Ramla is doing much better at this point in time on a large collection of medical therapy directed against inflammation including the daily use of prednisone currently at a dose of 10 mg/day.  I will lower that  dose to 7.5 mg today and will see her back in this clinic in 4 weeks.  We will hold off on any therapy for a sore throat at this point in time and hopefully this will just be a transient phenomenon.  Allena Katz, MD Allergy / Immunology [place]

## 2018-07-27 NOTE — Patient Instructions (Addendum)
  1. Continue to treat inflammation:   A.  Budesonide 0.5 mg nebulized twice a day  B.  Perforomist nebulized twice a day  C.  Fluticasone - 1 spray each nostril 1 times per day  D.  Montelukast 10mg  - 1 tablet one time per day  E.  Benralizumab injection   2. Continue to Treat reflux:   A. Omeprazole 40mg  tablet twice a day   B. Famotidine 40mg  tablet in PM  3. If Needed:   A. Proventil HFA - 2 inhalations every 4-6 hours  B. Duoneb nebulization every 4-6 hours   C. Patanase - 2 sprays each nostril 2 times per day    4. DECREASE Prednisone 5 mg tablet - 1+1/2 tablets daily until return to clinic  5. Therapy for sore throat?  6. Return to clinic in 4 weeks or earlier if problem

## 2018-07-31 ENCOUNTER — Ambulatory Visit: Payer: MEDICARE | Admitting: Allergy and Immunology

## 2018-07-31 ENCOUNTER — Encounter: Payer: Self-pay | Admitting: Allergy and Immunology

## 2018-08-02 ENCOUNTER — Telehealth: Payer: Self-pay | Admitting: Allergy and Immunology

## 2018-08-02 NOTE — Telephone Encounter (Signed)
Daughter would like a return call from a "nurse"  She would like to speak to them about [place].  She states Yoko did not go out of town because she is back to throwing up and she states Makaria is acting "different" seemingly acting "out of it"  Daughter states it is ok to leave a message and if she misses the call she will call right back.

## 2018-08-02 NOTE — Telephone Encounter (Signed)
Called patient daughter and advised per her earlier call if patient acting out of it along with other symptoms there could be emergent cause for her symptoms and per Dr Neldon Mc she should take her to the ER for evaluation.  Daughter advised she had some appts but would see what she could do .

## 2018-08-24 ENCOUNTER — Ambulatory Visit: Payer: PRIVATE HEALTH INSURANCE | Admitting: Allergy and Immunology

## 2018-08-28 ENCOUNTER — Encounter: Payer: Self-pay | Admitting: Allergy and Immunology

## 2018-08-28 ENCOUNTER — Other Ambulatory Visit: Payer: Self-pay

## 2018-08-28 ENCOUNTER — Ambulatory Visit (INDEPENDENT_AMBULATORY_CARE_PROVIDER_SITE_OTHER): Payer: MEDICARE | Admitting: Allergy and Immunology

## 2018-08-28 VITALS — BP 132/62 | HR 76 | Temp 97.5°F | Resp 18

## 2018-08-28 DIAGNOSIS — J4550 Severe persistent asthma, uncomplicated: Secondary | ICD-10-CM

## 2018-08-28 DIAGNOSIS — J3089 Other allergic rhinitis: Secondary | ICD-10-CM

## 2018-08-28 DIAGNOSIS — K219 Gastro-esophageal reflux disease without esophagitis: Secondary | ICD-10-CM | POA: Diagnosis not present

## 2018-08-28 NOTE — Progress Notes (Signed)
[name] [place] [facility] [place]   Follow-up Note  Referring Provider: Algis Greenhouse, MD Primary Provider: Algis Greenhouse, MD Date of Office Visit: 08/28/2018  Subjective:   Jennifer Melton (DOB: 1927/03/06) is a 85 y.o. female who returns to the [facility] on 08/28/2018 in re-evaluation of the following:  HPI: Jennifer Melton returns to this clinic in evaluation of asthma and allergic rhinitis and reflux induced respiratory disease and a history of diastolic dysfunction with coronary artery disease and a history of polycythemia vera.  I last saw her in this clinic on 27 July 2018.  During her last visit we attempted to taper down her chronic systemic steroid use from 10 mg daily to 7.5 mg daily.  At that point she was really doing very well regarding her respiratory tract issue and had very little issues with coughing or wheezing or issues with her nose and her reflux was under excellent control.  However, sometime in mid July she acquired a "pneumonia" on her right side requiring the administration of azithromycin.  Her daughter does not really remember her having a fever or any sputum production but just some coughing.  She apparently resolved that issue but unfortunately over the course of the past week she once again developed coughing with posttussive emesis.  There is no complaints of chest pain or fever or sputum production or any shortness of breath.  She just has coughing attacks that are associated with posttussive emesis.  Allergies as of 08/28/2018      Reactions   Ciprofloxacin Other (See Comments)   unknown   Penicillins Other (See Comments)   unknown   Pseudoephedrine Other (See Comments)   unknown      Medication List      albuterol (2.5 MG/3ML) 0.083% nebulizer solution Commonly known as: PROVENTIL Take 2.5 mg by nebulization every 4 (four) hours as needed for wheezing or shortness of breath.   albuterol 108 (90 Base) MCG/ACT  inhaler Commonly known as: Proventil HFA Inhale two puffs every four to six hours as needed for cough or wheeze.   aspirin EC 81 MG tablet Take 81 mg by mouth daily.   budesonide 0.5 MG/2ML nebulizer solution Commonly known as: PULMICORT Take 0.5 mg by nebulization 2 (two) times daily.   donepezil 5 MG tablet Commonly known as: ARICEPT Take by mouth.   famotidine 40 MG tablet Commonly known as: PEPCID Take one tablet every evening   fluticasone 50 MCG/ACT nasal spray Commonly known as: FLONASE Place 1 spray into both nostrils 2 (two) times daily.   Flutter Devi Use as directed   gabapentin 100 MG capsule Commonly known as: Neurontin One three times a day   hydroxyurea 500 MG capsule Commonly known as: HYDREA Take 500 mg by mouth daily. May take with food to minimize GI side effects.  1 capsule by mouth two times daily on Monday, Wednesday, and Friday. 1 capsule every other day once daily.   ipratropium 0.02 % nebulizer solution Commonly known as: ATROVENT Take 0.5 mg by nebulization every 4 (four) hours as needed for wheezing or shortness of breath.   ipratropium 0.06 % nasal spray Commonly known as: ATROVENT Can use two sprays in each nostril every six hours as needed to dry up runny nose.   isosorbide mononitrate 30 MG 24 hr tablet Commonly known as: IMDUR TAKE ONE TABLET BY MOUTH ONE TIME DAILY   losartan 50 MG tablet Commonly known as: COZAAR  Take 50 mg by mouth daily.   Melatonin 10 MG Tbdp Take by mouth as needed.   metoprolol succinate 25 MG 24 hr tablet Commonly known as: TOPROL-XL Take 0.5 tablets (12.5 mg total) by mouth daily.   montelukast 10 MG tablet Commonly known as: SINGULAIR TAKE ONE TABLET BY MOUTH AT BEDTIME   Olopatadine HCl 0.6 % Soln Place 1 spray into both nostrils 2 (two) times daily as needed (allergies).   omeprazole 40 MG capsule Commonly known as: PriLOSEC Take one capsule twice daily   PARoxetine 40 MG tablet  Commonly known as: PAXIL Take 40 mg by mouth daily.   Perforomist 20 MCG/2ML nebulizer solution Generic drug: formoterol Take 20 mcg by nebulization 2 (two) times daily.   predniSONE 5 MG tablet Commonly known as: DELTASONE Take "one and one-half" tablets until better then take one daily   simvastatin 20 MG tablet Commonly known as: ZOCOR TAKE ONE TABLET BY MOUTH DAILY AT BEDTIME   torsemide 20 MG tablet Commonly known as: DEMADEX Take 1 tablet (20 mg total) by mouth every morning.       Past Medical History:  Diagnosis Date  . Acute kidney injury ([place])   . Asthma   . Bilateral carotid bruits   . Dementia ([place])   . Dementia ([place])   . Dyspnea on exertion   . Heart murmur   . High blood pressure   . LBBB (left bundle branch block)   . Left bundle branch block   . Near syncope 01/04/2018  . Polycythemia   . Polycythemia vera ([place])   . Vomiting 01/03/2018    Past Surgical History:  Procedure Laterality Date  . CYST EXCISION     Cyst removal on left breast  . HEMORRHOID SURGERY      Review of systems negative except as noted in HPI / PMHx or noted below:  Review of Systems  Constitutional: Negative.   HENT: Negative.   Eyes: Negative.   Respiratory: Negative.   Cardiovascular: Negative.   Gastrointestinal: Negative.   Genitourinary: Negative.   Musculoskeletal: Negative.   Skin: Negative.   Neurological: Negative.   Endo/Heme/Allergies: Negative.   Psychiatric/Behavioral: Negative.      Objective:   Vitals:   08/28/18 1622  BP: 132/62  Pulse: 76  Resp: 18  Temp: (!) 97.5 F (36.4 C)  SpO2: 95%          Physical Exam Constitutional:      Appearance: She is not diaphoretic.  HENT:     Head: Normocephalic.     Right Ear: Tympanic membrane, ear canal and external ear normal.     Left Ear: Tympanic membrane, ear canal and external ear normal.     Nose: Nose normal. No mucosal edema or rhinorrhea.     Mouth/Throat:     Pharynx: Uvula  midline. No oropharyngeal exudate.  Eyes:     Conjunctiva/sclera: Conjunctivae normal.  Neck:     Thyroid: No thyromegaly.     Trachea: Trachea normal. No tracheal tenderness or tracheal deviation.  Cardiovascular:     Rate and Rhythm: Normal rate and regular rhythm.     Heart sounds: S1 normal and S2 normal. Murmur (Systolic) present.  Pulmonary:     Effort: No respiratory distress.     Breath sounds: No stridor. Wheezing (Right lower posterior inspiratory crackle and expiratory wheeze) present. No rales.  Lymphadenopathy:     Head:     Right side of head: No tonsillar adenopathy.  Left side of head: No tonsillar adenopathy.     Cervical: No cervical adenopathy.  Skin:    Findings: No erythema or rash.     Nails: There is no clubbing.   Neurological:     Mental Status: She is alert.     Diagnostics:    Spirometry was performed and demonstrated an FEV1 of 0.98 at 77 % of predicted.  Results of a chest x-ray obtained 30 May 2018 identified the following:  Cardiac silhouette is mildly enlarged. No mediastinal or hilar masses. No evidence of adenopathy.  Lungs are clear. No pleural effusion or pneumothorax.  Skeletal structures are demineralized but intact.  Assessment and Plan:   1. Not well controlled severe persistent asthma   2. Perennial allergic rhinitis   3. LPRD (laryngopharyngeal reflux disease)     1. Continue to treat inflammation:   A.  Budesonide 0.5 mg nebulized twice a day  B.  Perforomist nebulized twice a day  C.  Fluticasone - 1 spray each nostril 1 times per day  D.  Montelukast 10mg  - 1 tablet one time per day  E.  Benralizumab injection   2. Continue to Treat reflux:   A. Omeprazole 40mg  tablet twice a day   B. Famotidine 40mg  tablet in PM  3. If Needed:   A. Proventil HFA - 2 inhalations every 4-6 hours  B. Duoneb nebulization every 4-6 hours   C. Patanase - 2 sprays each nostril 2 times per day    4. INCREASE Prednisone 5 mg  tablet - 2 tablets daily until return to clinic  5. Obtain a Chest X-ray   6. Return to clinic in 4 weeks or earlier if problem  It is not entirely clear why Nyrah continues to have problems with cough and posttussive emesis.  Her exam today does suggest that that she may have a right lower lobe process present and we will evaluate that issue with a chest x-ray.  I will increase her oral steroid back up to 10 mg daily of prednisone as she did appear to have better control of her eosinophilic driven respiratory tract issue on 10 mg rather than 7.5 mg.  I will contact her daughter with the results of her chest x-ray once it is available for review we will make a determination on further evaluation and treatment based upon her response to therapy and the results of her chest x-ray.  Allena Katz, MD Allergy / Immunology [place]

## 2018-08-28 NOTE — Patient Instructions (Addendum)
  1. Continue to treat inflammation:   A.  Budesonide 0.5 mg nebulized twice a day  B.  Perforomist nebulized twice a day  C.  Fluticasone - 1 spray each nostril 1 times per day  D.  Montelukast 10mg  - 1 tablet one time per day  E.  Benralizumab injection   2. Continue to Treat reflux:   A. Omeprazole 40mg  tablet twice a day   B. Famotidine 40mg  tablet in PM  3. If Needed:   A. Proventil HFA - 2 inhalations every 4-6 hours  B. Duoneb nebulization every 4-6 hours   C. Patanase - 2 sprays each nostril 2 times per day    4. INCREASE Prednisone 5 mg tablet - 2 tablets daily until return to clinic  5. Obtain a Chest X-ray   6. Return to clinic in 4 weeks or earlier if problem

## 2018-08-29 ENCOUNTER — Encounter: Payer: Self-pay | Admitting: Allergy and Immunology

## 2018-08-30 ENCOUNTER — Telehealth: Payer: Self-pay

## 2018-08-30 NOTE — Telephone Encounter (Signed)
Spoke with daughter and informed her of the results in the telephone contact for the chest x-ray.

## 2018-08-30 NOTE — Telephone Encounter (Signed)
Attempted to contact patient/patient's daughter to inform her that September's chest x ray returned showing no pneumonia according to Dr. Neldon Mc.   Unable to leave a message.

## 2018-09-01 ENCOUNTER — Encounter: Payer: Self-pay | Admitting: *Deleted

## 2018-09-04 ENCOUNTER — Telehealth: Payer: Self-pay

## 2018-09-04 MED ORDER — AZITHROMYCIN 250 MG PO TABS: ORAL_TABLET | ORAL | 0 refills | Status: DC

## 2018-09-04 NOTE — Telephone Encounter (Signed)
Patient's daughter informed and prescription sent in to requested pharmacy.

## 2018-09-04 NOTE — Telephone Encounter (Signed)
Please have patient start azithromycin 250 mg - 1 tablet daily for 10 days.

## 2018-09-04 NOTE — Telephone Encounter (Signed)
Pt daughter called concerned about her mom not getting any better, she states Passion is still coughing, and spitting up mucus. I informed her it takes a little while for prednisone to work. She has been instructed to up her prednisone to 2 tablets instead of 1. She informed me the pneumonia test came back negative, so she wanted to know if she should bring her back in for further examination.  Daughter Leda Gauze) [phone]

## 2018-09-04 NOTE — Addendum Note (Signed)
Addended by: Farrel Demark R on: 09/04/2018 04:58 PM   Modules accepted: Orders

## 2018-09-05 ENCOUNTER — Other Ambulatory Visit: Payer: Self-pay | Admitting: Cardiology

## 2018-09-05 DIAGNOSIS — I10 Essential (primary) hypertension: Secondary | ICD-10-CM

## 2018-09-06 DIAGNOSIS — D45 Polycythemia vera: Secondary | ICD-10-CM | POA: Diagnosis not present

## 2018-09-12 ENCOUNTER — Telehealth: Payer: Self-pay | Admitting: *Deleted

## 2018-09-12 NOTE — Telephone Encounter (Signed)
Please have her arrange to get a high resolution chest CT scan for a persistent right lung process.

## 2018-09-12 NOTE — Telephone Encounter (Signed)
Insurance will not require a PA, I will get scheduled tomorrow.

## 2018-09-12 NOTE — Telephone Encounter (Signed)
Patient daughter called and advised mother not getting better.  She is still coughing till she throws up even while using all meds and albuterol prn.  She advised she has four days left of her Azithromycin. Please advise

## 2018-09-13 ENCOUNTER — Ambulatory Visit: Payer: PRIVATE HEALTH INSURANCE

## 2018-09-13 NOTE — Telephone Encounter (Signed)
Scheduled and Daughter informed. RH 09/14/18 arriving at 10:00 for a 10:30.

## 2018-09-15 ENCOUNTER — Encounter: Payer: Self-pay | Admitting: *Deleted

## 2018-09-15 DIAGNOSIS — J4550 Severe persistent asthma, uncomplicated: Secondary | ICD-10-CM

## 2018-09-18 ENCOUNTER — Ambulatory Visit (INDEPENDENT_AMBULATORY_CARE_PROVIDER_SITE_OTHER): Payer: MEDICARE | Admitting: *Deleted

## 2018-09-18 ENCOUNTER — Other Ambulatory Visit: Payer: Self-pay

## 2018-09-18 DIAGNOSIS — J4550 Severe persistent asthma, uncomplicated: Secondary | ICD-10-CM | POA: Diagnosis not present

## 2018-09-19 ENCOUNTER — Ambulatory Visit (INDEPENDENT_AMBULATORY_CARE_PROVIDER_SITE_OTHER): Payer: MEDICARE | Admitting: Internal Medicine

## 2018-09-19 ENCOUNTER — Other Ambulatory Visit: Payer: Self-pay

## 2018-09-19 ENCOUNTER — Encounter: Payer: Self-pay | Admitting: Internal Medicine

## 2018-09-19 DIAGNOSIS — R918 Other nonspecific abnormal finding of lung field: Secondary | ICD-10-CM

## 2018-09-19 DIAGNOSIS — Z23 Encounter for immunization: Secondary | ICD-10-CM | POA: Diagnosis not present

## 2018-09-19 DIAGNOSIS — J45991 Cough variant asthma: Secondary | ICD-10-CM | POA: Diagnosis not present

## 2018-09-19 NOTE — Patient Instructions (Addendum)
Prednisone 20 mg daily until better then 10 mg daily   Go ahead with the swallowing study  Use the flutter valve or purse lip breathing when wheezing  Stop atrovent   Plan A = Automatic = performist/ budesonide every 12 hours  Plan B = Backup Only use your albuterol nebulizer  as a rescue medication to be used if you can't catch your breath by resting or doing a relaxed purse lip breathing pattern.  - The less you use it, the better it will work when you need it. - Ok to use nebulizer up to  every 4 hours if you must but call for appointment if use goes up over your usual need - Don't leave home without it !!  (think of it like the spare tire for your car)   Omeprazole Take 30- 60 min before your first and last meals of the day   GERD (REFLUX)  is an extremely common cause of respiratory symptoms just like yours , many times with no obvious heartburn at all.    It can be treated with medication, but also with lifestyle changes including elevation of the head of your bed (ideally with 6 -8inch blocks under the headboard of your bed),  Smoking cessation, avoidance of late meals, excessive alcohol, and avoid fatty foods, chocolate, peppermint, colas, red wine, and acidic juices such as orange juice.  NO MINT OR MENTHOL PRODUCTS SO NO COUGH DROPS  USE SUGARLESS CANDY INSTEAD (Jolley ranchers or Stover's or Life Savers) or even ice chips will also do - the key is to swallow to prevent all throat clearing. NO OIL BASED VITAMINS - use powdered substitutes.  Avoid fish oil when coughing.    We will schedule a sinus CT   Please schedule a follow up office visit in 4 weeks, sooner if needed

## 2018-09-19 NOTE — Progress Notes (Signed)
Jennifer Melton, female    DOB: November 21, 1927,     MRN: [id number]   Brief patient profile:  65 yowf Never smoker from [place] moved go [place] fall 2019 to live with daughter ? Childhood illness other whooping cough but no chronic resp problems with new onset x 2016 cough to point of vomiting daily year round so eval in [facility] first with allergies to trees and placed on shots no better and  So stopped 12/28/17 and rx Benralizumab 02/23/2018 by Dr Neldon Mc but remains "steroid dep" to control coughing so referred to pulmonary clinic 05/22/2018 by Dr   Neldon Mc      History of Present Illness  05/22/2018  Pulmonary/ 1st office eval/Neilson Oehlert / last prednisone 2 days prior to OV  / on singulair/ laba/ics neb and prn duoneb and "lots of cough drops" Chief Complaint  Patient presents with  . Pulmonary Consult    Referred [name], NP. Pt c/o cough x 4 years- sometimes coughs until the point of vomiting. She is using her albuterol inhaler 3 x per day on average.   Dyspnea:  Doe = MMRC3 = can't walk 100 yards even at a slow pace at a flat grade s stopping due to sob  Cough: "tickle" starting to come again plast dose  prednisone a few days prior to OV   Sleep: on bed blocks/ cough some better noct than daytime but daughter notes some noct "wheze" SABA use: as above/ seems to helps though note baseline hfa very poor technique and now on maint laba/ics neb  rec Prednisone 10 mg take 2 daily until better, then 1 daily x 5 days and one half daily x 5 days and stop  Gabapentin 100 mg twice daily for now Continue performist and budesonide twice daily and stop ipatropium   Continue fosfenra per Dr Neldon Mc For cough > mucinex dm up to 1200 mg twice daily and flutter valve  GERD  Diet    . 06/21/2018  f/u ov/Jumana Paccione re: cough resp to prednisone/ worse since stopped  Chief Complaint  Patient presents with  . Follow-up    patient stopped the prednisone on 5-25 and is starting to get the congested cough back-non  productive  Dyspnea:  Inside the house / spends most of her time in chair due to R hip pain  Cough: dry tickle mostly  daytime  Sleeping: on bed blocks, no noct wheeze or cough  SABA use: no extra 02: no rec Prednisone 5 mg  X 2 daily until all better, then one daily until see Dr Neldon Mc   Let Dr Neldon Mc decide whether you need to return to [facility]    09/19/2018  f/u ov/Indy Kuck re: cough/sob  resp to prednisone  Chief Complaint  Patient presents with  . Follow-up    Breathing is worse and she is wheezing since the last visit. Her cough is worse to the point of vomiting.   Dyspnea:  Room to room  Cough: clear mucus to point of vomiting evening before or after supper  Sleeping: on bed blocks not up at night coughing any more  SABA use: way over using  02: no    No obvious day to day or daytime variability or assoc excess/ purulent sputum or mucus plugs or hemoptysis or cp or chest tightness, subjective wheeze or overt sinus or hb symptoms.   Sleeping as above  without nocturnal  or early am exacerbation  of respiratory  c/o's or need for noct saba. Also  denies any obvious fluctuation of symptoms with weather or environmental changes or other aggravating or alleviating factors except as outlined above   No unusual exposure hx or h/o childhood pna/ asthma or knowledge of premature birth.  Current Allergies, Complete Past Medical History, Past Surgical History, Family History, and Social History were reviewed in [employer] record.  ROS  The following are not active complaints unless bolded Hoarseness, sore throat, dysphagia, dental problems, itching, sneezing,  nasal congestion or discharge of excess mucus or purulent secretions, ear ache,   fever, chills, sweats, unintended wt loss or wt gain, classically pleuritic or exertional cp,  orthopnea pnd or arm/hand swelling  or leg swelling, presyncope, palpitations, abdominal pain, anorexia, nausea, vomiting, diarrhea  or  change in bowel habits or change in bladder habits, change in stools or change in urine, dysuria, hematuria,  rash, arthralgias, visual complaints, headache, numbness, weakness or ataxia or problems with walking or coordination,  change in mood or  memory.        Current Meds  Medication Sig  . albuterol (PROVENTIL HFA) 108 (90 Base) MCG/ACT inhaler Inhale two puffs every four to six hours as needed for cough or wheeze.  Marland Kitchen albuterol (PROVENTIL) (2.5 MG/3ML) 0.083% nebulizer solution Take 2.5 mg by nebulization every 4 (four) hours as needed for wheezing or shortness of breath.  Marland Kitchen aspirin EC 81 MG tablet Take 81 mg by mouth daily.  . budesonide (PULMICORT) 0.5 MG/2ML nebulizer solution Take 0.5 mg by nebulization 2 (two) times daily.  Marland Kitchen donepezil (ARICEPT) 5 MG tablet Take by mouth.  . famotidine (PEPCID) 40 MG tablet Take one tablet every evening  . fluticasone (FLONASE) 50 MCG/ACT nasal spray Place 1 spray into both nostrils 2 (two) times daily.  . formoterol (PERFOROMIST) 20 MCG/2ML nebulizer solution Take 20 mcg by nebulization 2 (two) times daily.  Marland Kitchen gabapentin (NEURONTIN) 100 MG capsule One three times a day (Patient taking differently: Take 100 mg by mouth 2 (two) times daily. One three times a day)  . hydroxyurea (HYDREA) 500 MG capsule Take 500 mg by mouth daily. May take with food to minimize GI side effects.  1 capsule by mouth two times daily on Monday, Wednesday, and Friday. 1 capsule every other day once daily.  Marland Kitchen ipratropium (ATROVENT) 0.02 % nebulizer solution Take 0.5 mg by nebulization every 4 (four) hours as needed for wheezing or shortness of breath.  Marland Kitchen ipratropium (ATROVENT) 0.06 % nasal spray Can use two sprays in each nostril every six hours as needed to dry up runny nose.  . isosorbide mononitrate (IMDUR) 30 MG 24 hr tablet TAKE ONE TABLET BY MOUTH ONE TIME DAILY   . losartan (COZAAR) 50 MG tablet Take 50 mg by mouth daily.  . Melatonin 10 MG TBDP Take by mouth as needed.    . metoprolol succinate (TOPROL-XL) 25 MG 24 hr tablet Take 0.5 tablets (12.5 mg total) by mouth daily.  . montelukast (SINGULAIR) 10 MG tablet TAKE ONE TABLET BY MOUTH AT BEDTIME   . omeprazole (PRILOSEC) 40 MG capsule Take one capsule twice daily  . PARoxetine (PAXIL) 40 MG tablet Take 40 mg by mouth daily.  . predniSONE (DELTASONE) 5 MG tablet Take "one and one-half" tablets until better then take one daily (Patient taking differently: 2 tablets daily)  . Respiratory Therapy Supplies (FLUTTER) DEVI Use as directed  . simvastatin (ZOCOR) 20 MG tablet TAKE ONE TABLET BY MOUTH DAILY AT BEDTIME   . torsemide (DEMADEX) 20 MG  tablet Take 1 tablet (20 mg total) by mouth every morning.   Current Facility-Administered Medications for the 09/19/18 encounter (Office Visit) with Tanda Rockers, MD  Medication  . Benralizumab SOSY 30 mg               Objective:      amb wf  nad     09/19/2018      156   06/21/18 148 lb (67.1 kg)  05/22/18 144 lb 9.6 oz (65.6 kg)  05/08/18 136 lb (61.7 kg)      Vital signs reviewed - Note on arrival 02 sats  97% on RA      HEENT: nl dentition, turbinates bilaterally, and oropharynx. Nl external ear canals without cough reflex   NECK :  without JVD/Nodes/TM/ nl carotid upstrokes bilaterally   LUNGS: no acc muscle use,  Nl contour chest which is clear to A and P bilaterally without cough on insp or exp maneuvers   CV:  RRR  no s3 or murmur or increase in P2, and no edema   ABD:  soft and nontender with nl inspiratory excursion in the supine position. No bruits or organomegaly appreciated, bowel sounds nl  MS:  Nl gait/ ext warm without deformities, calf tenderness, cyanosis or clubbing No obvious joint restrictions   SKIN: warm and dry without lesions    NEURO:  alert, approp, nl sensorium with  no motor or cerebellar deficits apparent.       I personally reviewed images and agree with radiology impression as follows:  Chest HRCT 09/14/18   Numerous 3 mm        Assessment

## 2018-09-20 ENCOUNTER — Encounter: Payer: Self-pay | Admitting: Internal Medicine

## 2018-09-20 DIAGNOSIS — R918 Other nonspecific abnormal finding of lung field: Secondary | ICD-10-CM | POA: Insufficient documentation

## 2018-09-20 NOTE — Assessment & Plan Note (Signed)
Onset around 2016/? steroid dep  - Benralizumab started 02/23/2018 - MBS 05/15/18 req from [facility] > nl MBS but noted prominence upper esophagus @  C4 ? prominent cricopharyngeus ?  - Gabapentin 100 mg bid 05/22/2018 >>> - 06/21/2018 rec maint pred = 5 mg floor as cough flares each time it's stopped despite rx with budesonide 0.5 mg bid and singulair  - sinus ct  Not convinced there is much asthma here - most of her symptoms are more c/w Upper airway cough syndrome (previously labeled PNDS),  is so named because it's frequently impossible to sort out how much is  CR/sinusitis with freq throat clearing (which can be related to primary GERD)   vs  causing  secondary (" extra esophageal")  GERD from wide swings in gastric pressure that occur with throat clearing, often  promoting self use of mint and menthol lozenges that reduce the lower esophageal sphincter tone and exacerbate the problem further in a cyclical fashion.   These are the same pts (now being labeled as having "irritable larynx syndrome" by some cough centers) who not infrequently have a history of having failed to tolerate ace inhibitors,  dry powder inhalers or biphosphonates or report having atypical/extraesophageal reflux symptoms that don't respond to standard doses of PPI  and are easily confused as having aecopd or asthma flares by even experienced allergists/ pulmonologists (myself included).    rec Sinus CT next Prednisone 20 mg daily until better then hold at 10 mg / day until seen

## 2018-09-20 NOTE — Assessment & Plan Note (Signed)
HRCT chest 09/14/18  3 mm numerous / never smoker  Although there are clearly abnormalities on CT scan, they should probably be considered "microscopic" since not obvious on plain cxr .     In the setting of obvious "macroscopic" health issues,  I am very reluctatnt to embark on an invasive w/u at this point in a pt age 85 and assured her these were likely benign and in no way contributing to her symptoms  Discussed in detail all the  indications, usual  risks and alternatives  relative to the benefits with patient who agrees to proceed with conservative f/u here prn if Dr Neldon Mc feels I can be of help but she must return with all meds in hand using a trust but verify approach to confirm accurate Medication  Reconciliation The principal here is that until we are certain that the  patients are doing what we've asked, it makes no sense to ask them to do more.    > 50 % of 25 min ov spent in counseling

## 2018-09-25 ENCOUNTER — Ambulatory Visit: Payer: PRIVATE HEALTH INSURANCE | Admitting: Allergy and Immunology

## 2018-09-27 ENCOUNTER — Encounter: Payer: Self-pay | Admitting: Allergy and Immunology

## 2018-09-27 ENCOUNTER — Ambulatory Visit (INDEPENDENT_AMBULATORY_CARE_PROVIDER_SITE_OTHER): Payer: MEDICARE | Admitting: Allergy and Immunology

## 2018-09-27 ENCOUNTER — Other Ambulatory Visit: Payer: Self-pay

## 2018-09-27 VITALS — BP 122/58 | HR 72 | Temp 98.0°F | Resp 18

## 2018-09-27 DIAGNOSIS — J3089 Other allergic rhinitis: Secondary | ICD-10-CM | POA: Diagnosis not present

## 2018-09-27 DIAGNOSIS — K219 Gastro-esophageal reflux disease without esophagitis: Secondary | ICD-10-CM

## 2018-09-27 DIAGNOSIS — J4550 Severe persistent asthma, uncomplicated: Secondary | ICD-10-CM | POA: Diagnosis not present

## 2018-09-27 DIAGNOSIS — J324 Chronic pansinusitis: Secondary | ICD-10-CM

## 2018-09-27 MED ORDER — OLOPATADINE HCL 0.6 % NA SOLN: NASAL | 5 refills | Status: AC

## 2018-09-27 NOTE — Patient Instructions (Addendum)
  1. Continue to treat inflammation:   A.  Budesonide 0.5 mg nebulized twice a day  B.  Perforomist nebulized twice a day  C.  Fluticasone - 1 spray each nostril 1 times per day  D.  Montelukast 10mg  - 1 tablet one time per day  E.  Benralizumab injection   2. Continue to Treat reflux:   A. Omeprazole 40mg  tablet twice a day   B. Famotidine 40mg  tablet in PM  3. If Needed:   A. Proventil HFA - 2 inhalations every 4-6 hours  B. Albuterol nebulization every 4-6 hours   C. Patanase - 2 sprays each nostril 2 times per day    4. Continue on 20 mg of prednisone daily for 1 more week, then decrease to 15 mg daily for 2 weeks, then 10 mg daily, every day  5. Obtain sinus CT scan  6. Consider a hearing aid  7. Return to clinic in 8 weeks or earlier if problem  8. Plan for fall flu vaccine (and COVID vaccine)

## 2018-09-27 NOTE — Progress Notes (Signed)
[facility] [place] [facility] [place]   Follow-up Note  Referring Provider: Algis Greenhouse, MD Primary Provider: Algis Greenhouse, MD Date of Office Visit: 09/27/2018  Subjective:   Jennifer Melton (DOB: [birth year]) is a 85 y.o. female who returns to the [facility] on 09/27/2018 in re-evaluation of the following:  HPI: Jennifer Melton returns to this clinic in reevaluation of asthma and allergic rhinitis and reflux induced respiratory disease among other medical problems.  I last saw her in this clinic on 28 October 2018.  During the interval we had her visit with Dr. Melvyn Novas for multiple small pulmonary nodules affecting her apices bilaterally.  He felt that this was a issue that did not warrant any further evaluation especially given her age but because of her continued coughing he did increase her prednisone to 20 mg daily from a dose of 10 mg daily.  At a dose of 20 mg daily Jennifer Melton does much better and is now starting to resolve her cough.  Jennifer Melton still continues on a large collection of therapy directed against respiratory tract inflammation and reflux.  Allergies as of 09/27/2018      Reactions   Ciprofloxacin Other (See Comments)   unknown   Penicillins Other (See Comments)   unknown   Pseudoephedrine Other (See Comments)   unknown      Medication List      albuterol (2.5 MG/3ML) 0.083% nebulizer solution Commonly known as: PROVENTIL Take 2.5 mg by nebulization every 4 (four) hours as needed for wheezing or shortness of breath.   aspirin EC 81 MG tablet Take 81 mg by mouth daily.   budesonide 0.5 MG/2ML nebulizer solution Commonly known as: PULMICORT Take 0.5 mg by nebulization 2 (two) times daily.   donepezil 5 MG tablet Commonly known as: ARICEPT Take by mouth.   famotidine 40 MG tablet Commonly known as: PEPCID Take one tablet every evening   fluticasone 50 MCG/ACT nasal spray Commonly known as: FLONASE Place 1 spray into both nostrils 2  (two) times daily.   Flutter Devi Use as directed   gabapentin 100 MG capsule Commonly known as: Neurontin One three times a day   hydroxyurea 500 MG capsule Commonly known as: HYDREA Take 500 mg by mouth daily. May take with food to minimize GI side effects.  1 capsule by mouth two times daily on Monday, Wednesday, and Friday. 1 capsule every other day once daily.   ipratropium 0.06 % nasal spray Commonly known as: ATROVENT Can use two sprays in each nostril every six hours as needed to dry up runny nose.   isosorbide mononitrate 30 MG 24 hr tablet Commonly known as: IMDUR TAKE ONE TABLET BY MOUTH ONE TIME DAILY   losartan 50 MG tablet Commonly known as: COZAAR Take 50 mg by mouth daily.   Melatonin 10 MG Tbdp Take by mouth as needed.   metoprolol succinate 25 MG 24 hr tablet Commonly known as: TOPROL-XL Take 0.5 tablets (12.5 mg total) by mouth daily.   montelukast 10 MG tablet Commonly known as: SINGULAIR TAKE ONE TABLET BY MOUTH AT BEDTIME   omeprazole 40 MG capsule Commonly known as: PriLOSEC Take one capsule twice daily   PARoxetine 40 MG tablet Commonly known as: PAXIL Take 40 mg by mouth daily.   Patanase 0.6 % Soln Generic drug: Olopatadine HCl Place into the nose as needed.   Perforomist 20 MCG/2ML nebulizer solution Generic drug: formoterol Take 20 mcg by nebulization 2 (  two) times daily.   predniSONE 5 MG tablet Commonly known as: DELTASONE Take "one and one-half" tablets until better then take one daily What changed: additional instructions   simvastatin 20 MG tablet Commonly known as: ZOCOR TAKE ONE TABLET BY MOUTH DAILY AT BEDTIME   torsemide 20 MG tablet Commonly known as: DEMADEX Take 1 tablet (20 mg total) by mouth every morning.       Past Medical History:  Diagnosis Date  . Acute kidney injury ([place])   . Asthma   . Bilateral carotid bruits   . Dementia ([place])   . Dementia ([place])   . Dyspnea on exertion   . Heart murmur    . High blood pressure   . LBBB (left bundle branch block)   . Left bundle branch block   . Near syncope 01/04/2018  . Polycythemia   . Polycythemia vera ([place])   . Vomiting 01/03/2018    Past Surgical History:  Procedure Laterality Date  . CYST EXCISION     Cyst removal on left breast  . HEMORRHOID SURGERY      Review of systems negative except as noted in HPI / PMHx or noted below:  Review of Systems  Constitutional: Negative.   HENT: Negative.   Eyes: Negative.   Respiratory: Negative.   Cardiovascular: Negative.   Gastrointestinal: Negative.   Genitourinary: Negative.   Musculoskeletal: Negative.   Skin: Negative.   Neurological: Negative.   Endo/Heme/Allergies: Negative.   Psychiatric/Behavioral: Negative.      Objective:   Vitals:   09/27/18 1553  BP: (!) 122/58  Pulse: 72  Resp: 18  Temp: 98 F (36.7 C)  SpO2: 96%          Physical Exam Constitutional:      Appearance: Jennifer Melton is not diaphoretic.     Comments: Unable to hear conversation  HENT:     Head: Normocephalic.     Right Ear: Tympanic membrane, ear canal and external ear normal.     Left Ear: Tympanic membrane, ear canal and external ear normal.     Nose: Nose normal. No mucosal edema or rhinorrhea.     Mouth/Throat:     Pharynx: Uvula midline. No oropharyngeal exudate.  Eyes:     Conjunctiva/sclera: Conjunctivae normal.  Neck:     Thyroid: No thyromegaly.     Trachea: Trachea normal. No tracheal tenderness or tracheal deviation.  Cardiovascular:     Rate and Rhythm: Normal rate and regular rhythm.     Heart sounds: S1 normal and S2 normal. Murmur (Systolic) present.  Pulmonary:     Effort: No respiratory distress.     Breath sounds: Normal breath sounds. No stridor. No wheezing or rales.  Lymphadenopathy:     Head:     Right side of head: No tonsillar adenopathy.     Left side of head: No tonsillar adenopathy.     Cervical: No cervical adenopathy.  Skin:    Findings: No erythema  or rash.     Nails: There is no clubbing.   Neurological:     Mental Status: Jennifer Melton is alert.     Diagnostics:    Spirometry was performed and demonstrated an FEV1 of 1.04 at 82 % of predicted.  Results of a sinus CT scan obtained 14 September 2018 identified numerous tiny pulmonary nodules in the lung apices measuring 3 8 mm or smaller.  Jennifer Melton had minimal bibasilar scarring and/or atelectasis.    Results of a modified barium swallow obtained 21 September 2018 identified no significant esophageal dysmotility or swallowing disorder without any aspiration but with a very prominent cricopharyngeus muscle.  Assessment and Plan:   1. Asthma, severe persistent, well-controlled   2. Perennial allergic rhinitis   3. LPRD (laryngopharyngeal reflux disease)   4. Chronic pansinusitis     1. Continue to treat inflammation:   A.  Budesonide 0.5 mg nebulized twice a day  B.  Perforomist nebulized twice a day  C.  Fluticasone - 1 spray each nostril 1 times per day  D.  Montelukast 10mg  - 1 tablet one time per day  E.  Benralizumab injection   2. Continue to Treat reflux:   A. Omeprazole 40mg  tablet twice a day   B. Famotidine 40mg  tablet in PM  3. If Needed:   A. Proventil HFA - 2 inhalations every 4-6 hours  B. Albuterol nebulization every 4-6 hours   C. Patanase - 2 sprays each nostril 2 times per day    4. Continue on 20 mg of prednisone daily for 1 more week, then decrease to 15 mg daily for 2 weeks, then 10 mg daily, every day  5. Obtain sinus CT scan  6. Consider a hearing aid  7. Return to clinic in 8 weeks or earlier if problem  8. Plan for fall flu vaccine (and COVID vaccine)  Jennifer Melton is very steroid responsive but obviously we can not keep her on 20 mg of prednisone per day.  Now that Jennifer Melton has shown improvement over the course of the past 2 weeks with increased dosing of prednisone we can attempt to taper down her dose aiming for the least amount required to help with her  respiratory tract symptoms.  Jennifer Melton continues on a large collection of anti-inflammatory agents for her airway and therapy directed against reflux.  We will scan her sinuses for possible contribution of chronic sinusitis.  I will see her back in this clinic in 8 weeks or earlier if there is a problem.  Allena Katz, MD Allergy / Immunology [place]

## 2018-09-28 ENCOUNTER — Ambulatory Visit: Payer: PRIVATE HEALTH INSURANCE | Admitting: Cardiology

## 2018-09-28 ENCOUNTER — Encounter: Payer: Self-pay | Admitting: Allergy and Immunology

## 2018-10-03 ENCOUNTER — Other Ambulatory Visit: Payer: Self-pay | Admitting: Cardiology

## 2018-10-03 ENCOUNTER — Telehealth: Payer: Self-pay

## 2018-10-03 DIAGNOSIS — I5032 Chronic diastolic (congestive) heart failure: Secondary | ICD-10-CM

## 2018-10-03 NOTE — Telephone Encounter (Signed)
Called and informed daughter, Joyce Copa, that Marcianna is scheduled for her Sinus CT scan on Thursday, September 10th, at [facility], needing to check in at 10:00am. NOTE: Called insurance and no PA required; faxed order form to [facility]

## 2018-10-06 ENCOUNTER — Other Ambulatory Visit: Payer: Self-pay | Admitting: Cardiology

## 2018-10-06 ENCOUNTER — Other Ambulatory Visit: Payer: Self-pay | Admitting: Internal Medicine

## 2018-10-06 DIAGNOSIS — I10 Essential (primary) hypertension: Secondary | ICD-10-CM

## 2018-10-09 ENCOUNTER — Telehealth: Payer: Self-pay | Admitting: Allergy and Immunology

## 2018-10-09 NOTE — Telephone Encounter (Signed)
Leda Gauze ( Latoy's daughter) called in and states they are moving out of state.  She would like to get refills on Famotidine and Montelukast.  Leda Gauze called and states the Dr. Neldon Mc started [place] on Prednisone and Dr. Melvyn Novas changed it to 20mg  daily for cough.  Leda Gauze would like to know if Dr. Neldon Mc can refill it for the 20 mg daily or does she need to contact Dr. Melvyn Novas?  Please advise.

## 2018-10-09 NOTE — Telephone Encounter (Signed)
Please contact daughter and find out about the the details concerning her move out of state.  Please follow these directions concerning prednisone.Continue on 20 mg of prednisone daily for 1 more week, then decrease to 15 mg daily for 2 weeks, then 10 mg daily, every day

## 2018-10-09 NOTE — Telephone Encounter (Signed)
Called and spoke with Jennifer Melton, patients daughter. She states that she is getting ready to have a complete shoulder replacement and she will not be able to care for [name] is wanting to move back "home" to live with her other daughter. Informed her of the prednisone taper, she voiced understanding. Jennifer Melton wanted to thank you for everything you have done for [facility].

## 2018-10-10 NOTE — Telephone Encounter (Signed)
Appropriate for refill

## 2018-10-27 ENCOUNTER — Ambulatory Visit: Payer: PRIVATE HEALTH INSURANCE | Admitting: Cardiology

## 2018-10-30 ENCOUNTER — Encounter: Payer: Self-pay | Admitting: Allergy and Immunology

## 2018-10-30 ENCOUNTER — Telehealth: Payer: Self-pay | Admitting: Allergy and Immunology

## 2018-10-30 NOTE — Telephone Encounter (Signed)
Daughter called in and is trying to get more medication for [facility].  Leda Gauze has tried to contact [facility] and they are no longer in [place].  She is having a hard time getting refills for Sharren's nebulizer and she states Aaisha is almost out.  She was told by the [facility] office that they had to go through the office they originally received the order from.  [facility] is no longer available so she is stuck.  Leda Gauze did state she was going to call the [place] office and front office and see what they can do.  Leda Gauze would like someone to research it please.

## 2018-10-30 NOTE — Telephone Encounter (Signed)
Called and talked with Leda Gauze.  Her husband and her figured out that the medications came from [facility] so they contacted the company and are having everything transferred for [facility].

## 2018-11-09 ENCOUNTER — Telehealth: Payer: Self-pay | Admitting: Internal Medicine

## 2018-11-09 NOTE — Telephone Encounter (Signed)
Spoke with Leda Gauze and advised her that the pt had her flu shot on 09/19/2018. She understood and nothing further is needed.

## 2018-11-13 ENCOUNTER — Ambulatory Visit: Payer: MEDICARE

## 2018-11-15 ENCOUNTER — Telehealth: Payer: Self-pay

## 2018-11-15 NOTE — Telephone Encounter (Signed)
There is nothing to transfer. We were buying her Berna Bue and billing her Ins for it

## 2018-11-15 NOTE — Telephone Encounter (Signed)
Patient is transferring care to [facility] [place] in [place]. She wishes to transfer her immunotherapy to that office. Thayer Ohm is the Manufacturing engineer ext 31 Phone [phone] Fax [phone].

## 2018-11-16 NOTE — Telephone Encounter (Signed)
Do I need to contact the other office and let them know this?

## 2018-11-16 NOTE — Telephone Encounter (Signed)
I called and left message regarding patient buy and bill Fasenra 30mg  every 8 weeks and number if she needs to reach out to me

## 2018-11-22 ENCOUNTER — Ambulatory Visit: Payer: MEDICARE | Admitting: Allergy and Immunology

## 2018-12-22 ENCOUNTER — Ambulatory Visit: Payer: PRIVATE HEALTH INSURANCE | Admitting: Internal Medicine

## 2018-12-22 ENCOUNTER — Ambulatory Visit: Payer: PRIVATE HEALTH INSURANCE

## 2019-02-15 ENCOUNTER — Other Ambulatory Visit: Payer: Self-pay | Admitting: Allergy and Immunology

## 2019-06-04 IMAGING — DX DG CHEST 2V
2 series · 2 of 2 positions shown · non-contrast
Comparison: None.

CLINICAL DATA: Emesis

EXAM:
CHEST - 2 VIEW

[w chest pa]
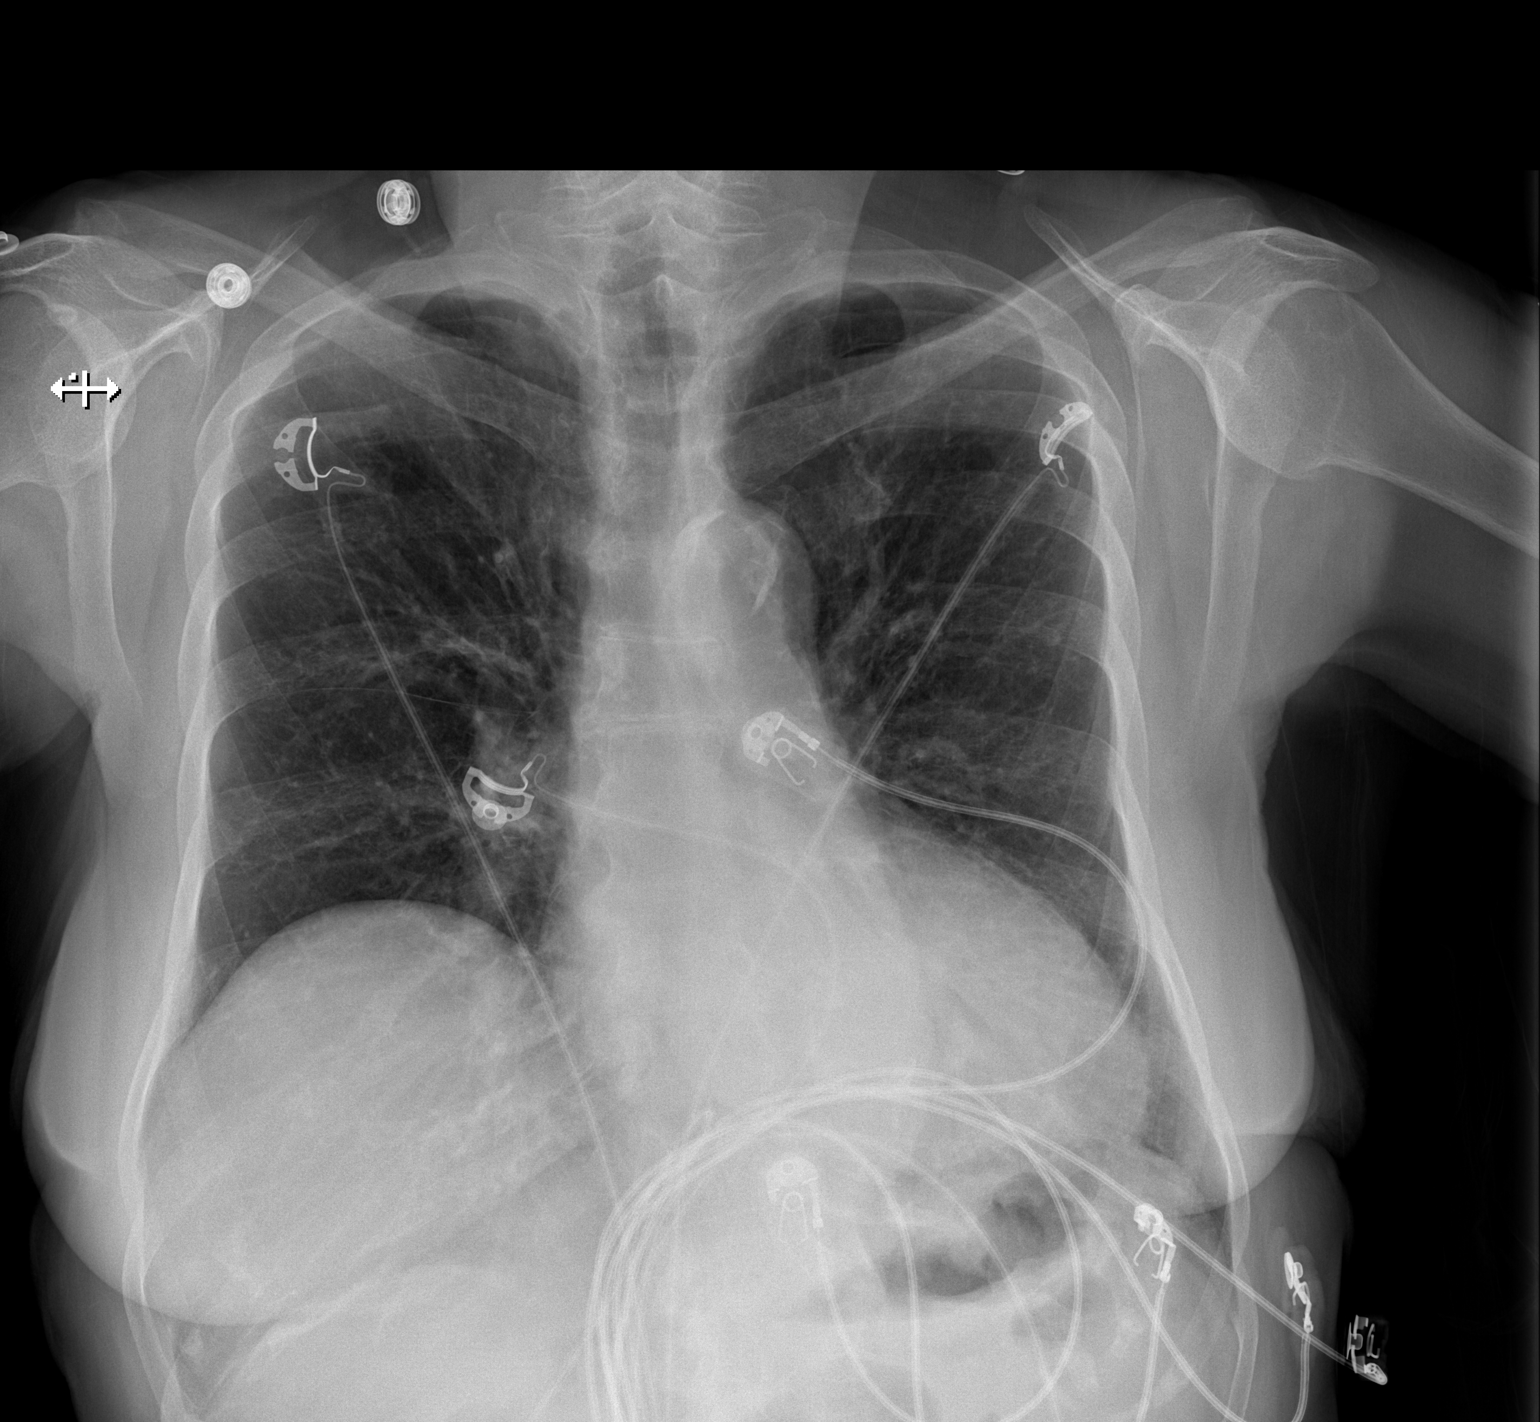

[w chest lat]
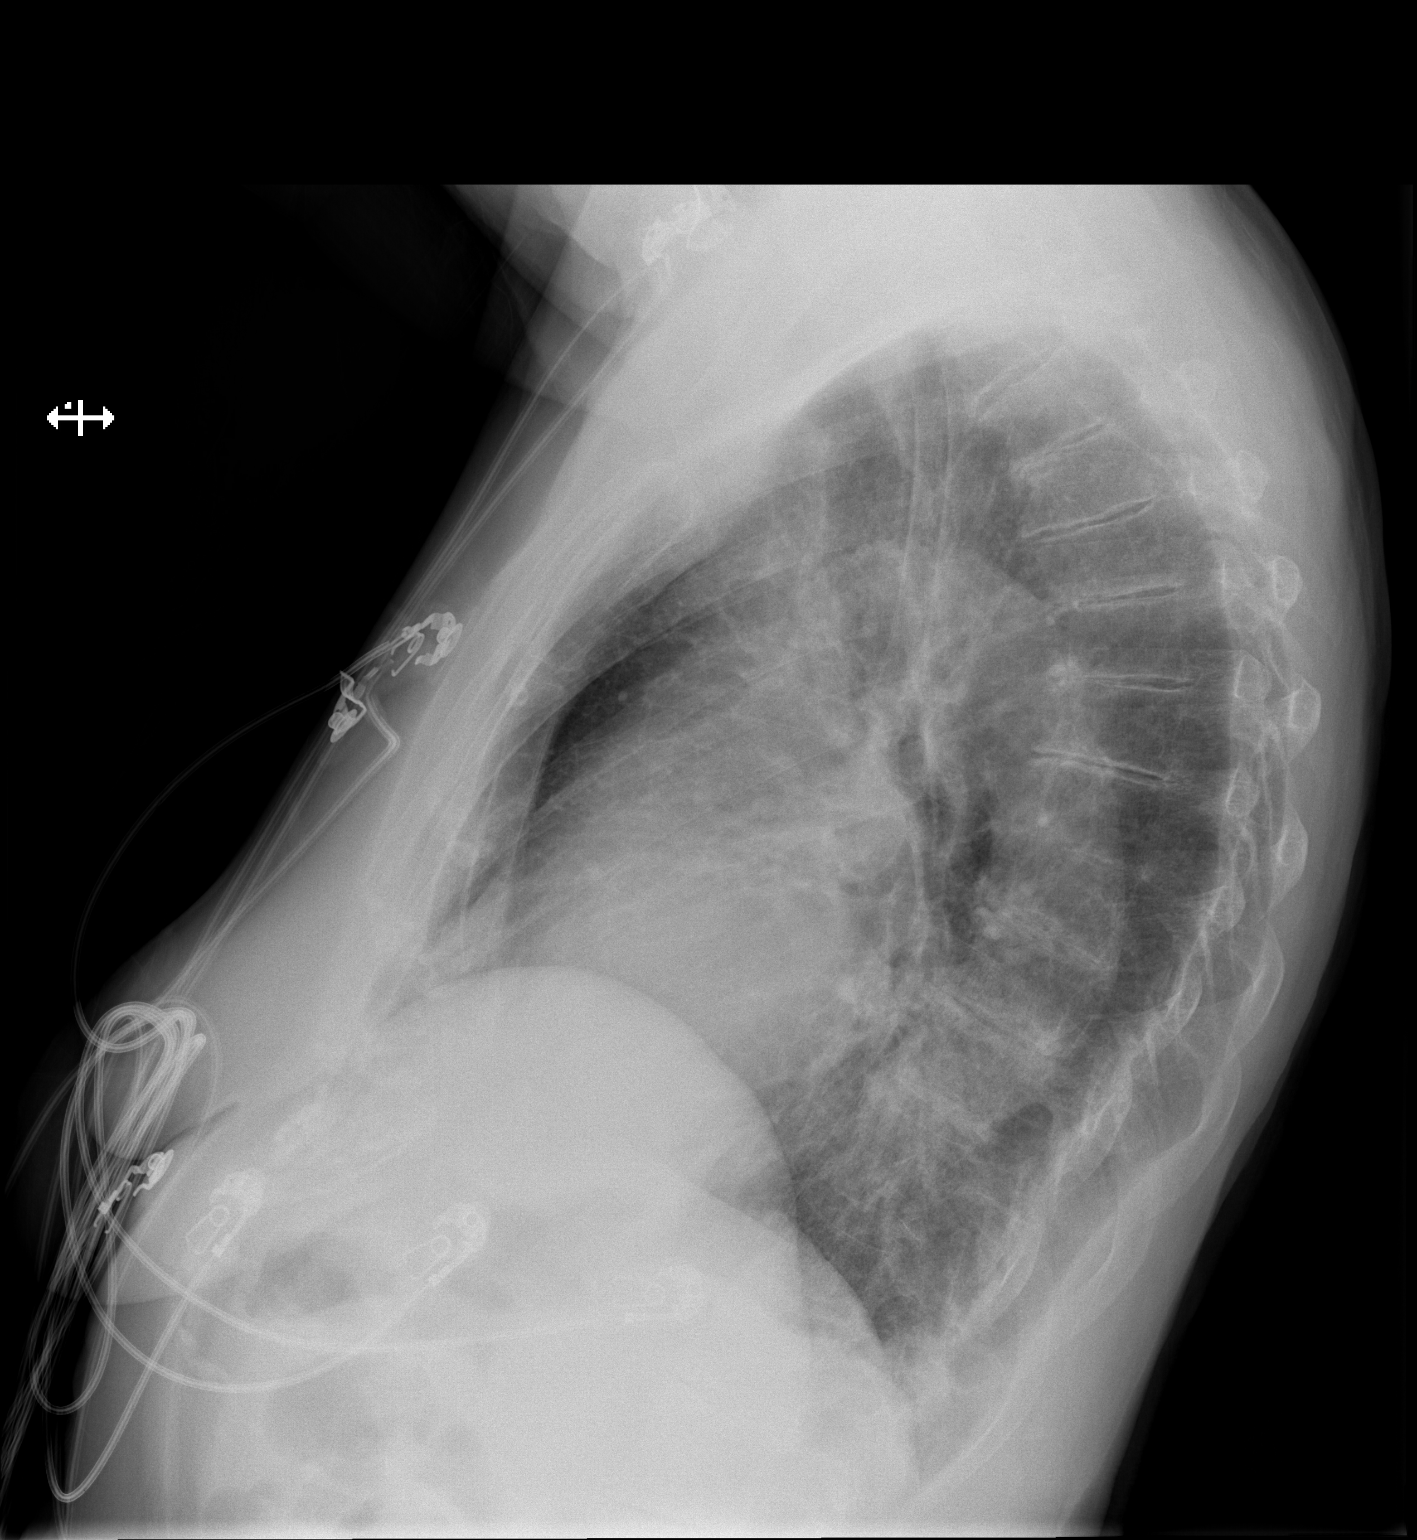

[2 of 2 positions shown; findings below may reference images not displayed]

FINDINGS: Cardiac enlargement without heart failure. Atherosclerotic aortic
arch. Lungs are clear without infiltrate or effusion.
IMPRESSION: No active cardiopulmonary disease.

## 2022-04-26 DEATH — deceased
